# Patient Record
Sex: Female | Born: 1990 | Race: White | Hispanic: No | Smoking: Current every day smoker
Health system: Southern US, Community
[De-identification: ages and names within clinical notes are randomized; demographics above are authoritative.]

## PROBLEM LIST (undated history)

## (undated) ENCOUNTER — Inpatient Hospital Stay (HOSPITAL_COMMUNITY): Payer: Self-pay

## (undated) DIAGNOSIS — F419 Anxiety disorder, unspecified: Secondary | ICD-10-CM

## (undated) DIAGNOSIS — F319 Bipolar disorder, unspecified: Secondary | ICD-10-CM

## (undated) HISTORY — PX: TONSILLECTOMY: SUR1361

## (undated) HISTORY — PX: TYMPANOSTOMY TUBE PLACEMENT: SHX32

---
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
## 2013-10-01 ENCOUNTER — Ambulatory Visit (HOSPITAL_COMMUNITY): Payer: Self-pay | Admitting: Psychology

## 2013-10-16 ENCOUNTER — Emergency Department (HOSPITAL_COMMUNITY): Payer: 59

## 2013-10-16 ENCOUNTER — Encounter (HOSPITAL_COMMUNITY): Payer: Self-pay | Admitting: Emergency Medicine

## 2013-10-16 ENCOUNTER — Emergency Department (HOSPITAL_COMMUNITY)
Admission: EM | Admit: 2013-10-16 | Discharge: 2013-10-16 | Disposition: A | Discharge status: Home / Self Care | Payer: 59 | Attending: Emergency Medicine | Admitting: Emergency Medicine

## 2013-10-16 DIAGNOSIS — Y9389 Activity, other specified: Secondary | ICD-10-CM | POA: Insufficient documentation

## 2013-10-16 DIAGNOSIS — S0990XA Unspecified injury of head, initial encounter: Secondary | ICD-10-CM | POA: Insufficient documentation

## 2013-10-16 DIAGNOSIS — S9030XA Contusion of unspecified foot, initial encounter: Secondary | ICD-10-CM | POA: Insufficient documentation

## 2013-10-16 DIAGNOSIS — Z8659 Personal history of other mental and behavioral disorders: Secondary | ICD-10-CM | POA: Insufficient documentation

## 2013-10-16 DIAGNOSIS — F17200 Nicotine dependence, unspecified, uncomplicated: Secondary | ICD-10-CM | POA: Insufficient documentation

## 2013-10-16 DIAGNOSIS — Y92410 Unspecified street and highway as the place of occurrence of the external cause: Secondary | ICD-10-CM | POA: Insufficient documentation

## 2013-10-16 DIAGNOSIS — S9031XA Contusion of right foot, initial encounter: Secondary | ICD-10-CM

## 2013-10-16 HISTORY — DX: Bipolar disorder, unspecified: F31.9

## 2013-10-16 HISTORY — DX: Anxiety disorder, unspecified: F41.9

## 2013-10-16 MED ORDER — ACETAMINOPHEN 325 MG PO TABS
650.0000 mg | ORAL_TABLET | Freq: Once | ORAL | Status: AC
  Administered 2013-10-16: 650 mg via ORAL
  Filled 2013-10-16: qty 2
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
## 2013-11-07 ENCOUNTER — Ambulatory Visit (HOSPITAL_COMMUNITY): Payer: Self-pay | Admitting: Psychiatry

## 2017-07-04 ENCOUNTER — Other Ambulatory Visit (HOSPITAL_COMMUNITY): Payer: Self-pay | Admitting: Nurse Practitioner

## 2017-07-04 DIAGNOSIS — Z369 Encounter for antenatal screening, unspecified: Secondary | ICD-10-CM

## 2017-07-12 ENCOUNTER — Ambulatory Visit (HOSPITAL_COMMUNITY): Payer: No Typology Code available for payment source

## 2017-07-12 ENCOUNTER — Encounter (HOSPITAL_COMMUNITY): Payer: Self-pay

## 2017-07-13 ENCOUNTER — Other Ambulatory Visit (HOSPITAL_COMMUNITY): Payer: Self-pay | Admitting: Nurse Practitioner

## 2017-07-13 ENCOUNTER — Encounter (HOSPITAL_COMMUNITY): Payer: Self-pay

## 2017-07-13 ENCOUNTER — Ambulatory Visit (HOSPITAL_COMMUNITY)
Admission: RE | Admit: 2017-07-13 | Discharge: 2017-07-13 | Disposition: A | Discharge status: Home / Self Care | Payer: 59 | Source: Ambulatory Visit | Attending: Nurse Practitioner | Admitting: Nurse Practitioner

## 2017-07-13 DIAGNOSIS — Z369 Encounter for antenatal screening, unspecified: Secondary | ICD-10-CM | POA: Insufficient documentation

## 2017-07-13 DIAGNOSIS — O359XX0 Maternal care for (suspected) fetal abnormality and damage, unspecified, not applicable or unspecified: Secondary | ICD-10-CM

## 2017-07-13 DIAGNOSIS — Z3682 Encounter for antenatal screening for nuchal translucency: Secondary | ICD-10-CM

## 2017-07-13 DIAGNOSIS — Z3A12 12 weeks gestation of pregnancy: Secondary | ICD-10-CM | POA: Insufficient documentation

## 2017-08-01 ENCOUNTER — Other Ambulatory Visit: Payer: Self-pay

## 2017-08-07 ENCOUNTER — Other Ambulatory Visit (HOSPITAL_COMMUNITY): Payer: Self-pay | Admitting: Nurse Practitioner

## 2017-08-07 DIAGNOSIS — Z3A19 19 weeks gestation of pregnancy: Secondary | ICD-10-CM

## 2017-08-07 DIAGNOSIS — O99322 Drug use complicating pregnancy, second trimester: Secondary | ICD-10-CM

## 2017-08-07 DIAGNOSIS — Z3689 Encounter for other specified antenatal screening: Secondary | ICD-10-CM

## 2017-08-24 ENCOUNTER — Encounter (HOSPITAL_COMMUNITY): Payer: Self-pay | Admitting: Nurse Practitioner

## 2017-08-29 ENCOUNTER — Other Ambulatory Visit (HOSPITAL_COMMUNITY): Payer: Self-pay

## 2017-09-01 ENCOUNTER — Ambulatory Visit (HOSPITAL_COMMUNITY)
Admission: RE | Admit: 2017-09-01 | Discharge: 2017-09-01 | Disposition: A | Discharge status: Home / Self Care | Payer: 59 | Source: Ambulatory Visit | Attending: Nurse Practitioner | Admitting: Nurse Practitioner

## 2017-09-01 DIAGNOSIS — O358XX0 Maternal care for other (suspected) fetal abnormality and damage, not applicable or unspecified: Secondary | ICD-10-CM | POA: Insufficient documentation

## 2017-09-01 DIAGNOSIS — F1910 Other psychoactive substance abuse, uncomplicated: Secondary | ICD-10-CM

## 2017-09-01 DIAGNOSIS — Z3A19 19 weeks gestation of pregnancy: Secondary | ICD-10-CM | POA: Diagnosis not present

## 2017-09-01 DIAGNOSIS — O99322 Drug use complicating pregnancy, second trimester: Secondary | ICD-10-CM | POA: Insufficient documentation

## 2017-09-01 DIAGNOSIS — Z3689 Encounter for other specified antenatal screening: Secondary | ICD-10-CM

## 2017-11-19 ENCOUNTER — Inpatient Hospital Stay (HOSPITAL_COMMUNITY)
Admission: AD | Admit: 2017-11-19 | Discharge: 2017-11-19 | Disposition: A | Discharge status: Home / Self Care | Payer: 59 | Source: Ambulatory Visit | Attending: Obstetrics & Gynecology | Admitting: Obstetrics & Gynecology

## 2017-11-19 ENCOUNTER — Encounter (HOSPITAL_COMMUNITY): Payer: Self-pay

## 2017-11-19 ENCOUNTER — Other Ambulatory Visit: Payer: Self-pay

## 2017-11-19 DIAGNOSIS — Z818 Family history of other mental and behavioral disorders: Secondary | ICD-10-CM | POA: Insufficient documentation

## 2017-11-19 DIAGNOSIS — Z87891 Personal history of nicotine dependence: Secondary | ICD-10-CM | POA: Insufficient documentation

## 2017-11-19 DIAGNOSIS — Z823 Family history of stroke: Secondary | ICD-10-CM | POA: Insufficient documentation

## 2017-11-19 DIAGNOSIS — Z811 Family history of alcohol abuse and dependence: Secondary | ICD-10-CM | POA: Diagnosis not present

## 2017-11-19 DIAGNOSIS — O368130 Decreased fetal movements, third trimester, not applicable or unspecified: Secondary | ICD-10-CM | POA: Insufficient documentation

## 2017-11-19 DIAGNOSIS — Z3689 Encounter for other specified antenatal screening: Secondary | ICD-10-CM

## 2017-11-19 DIAGNOSIS — Z3A31 31 weeks gestation of pregnancy: Secondary | ICD-10-CM

## 2017-11-19 LAB — URINALYSIS, ROUTINE W REFLEX MICROSCOPIC
Bilirubin Urine: NEGATIVE
Glucose, UA: NEGATIVE mg/dL
Hgb urine dipstick: NEGATIVE
KETONES UR: NEGATIVE mg/dL
Leukocytes, UA: NEGATIVE
NITRITE: NEGATIVE
PROTEIN: NEGATIVE mg/dL
Specific Gravity, Urine: 1.002 — ABNORMAL LOW (ref 1.005–1.030)
pH: 6 (ref 5.0–8.0)

## 2017-11-19 NOTE — MAU Provider Note (Signed)
History     CSN: [id number]  Arrival date and time: 11/19/17 1815   [id number] Provider Initiated Contact with Patient 11/19/17 1834      Chief Complaint  Patient presents with  . Decreased Fetal Movement   G1 @31 .0 wks here with decreased FM. She states she felt her abdomen get hard this afternoon and then after that she didn't feel as much movement. Denies VB, LOF, and ctx. No pain now. Felt movement on the way here. She is eating and drinking well today.   OB History    Gravida Para Term Preterm AB Living   1             SAB TAB Ectopic Multiple Live Births                  Past Medical History:  Diagnosis Date  . Anxiety    med prior to pregn  . Bipolar 1 disorder [facility](HCC)     Past Surgical History:  Procedure Laterality Date  . TONSILLECTOMY    . TONSILLECTOMY    . TYMPANOSTOMY TUBE PLACEMENT      Family History  Problem Relation Age of Onset  . Depression Mother   . Stroke Mother   . Alcohol abuse Father   . COPD Maternal Grandfather   . Alcohol abuse Paternal Grandmother   . Heart disease Paternal Grandfather     Social History   Tobacco Use  . Smoking status: Former Games developermoker  . Smokeless tobacco: Never Used  Substance Use Topics  . Alcohol use: No  . Drug use: No    Comment: quit with pregnancy    Allergies: No Known Allergies  Medications Prior to Admission  Medication Sig Dispense Refill Last Dose  . HydrOXYzine Pamoate (VISTARIL PO) Take by mouth.   Taking  . Prenatal Vit-Fe Fumarate-FA (PRENATAL VITAMIN PO) Take by mouth.   Taking    Review of Systems  Gastrointestinal: Negative for abdominal pain.  Genitourinary: Negative for vaginal bleeding.   Physical Exam   Blood pressure 130/75, pulse 79, temperature 98.1 F (36.7 C), temperature source Oral, resp. rate 18, last menstrual period 04/09/2017, SpO2 99 %.  Physical Exam  Nursing note and vitals reviewed. Constitutional: She is oriented to person, place, and time. She appears  well-developed and well-nourished. No distress.  HENT:  Head: Normocephalic and atraumatic.  Neck: Normal range of motion.  Respiratory: Effort normal. No respiratory distress.  GI: Soft. She exhibits no distension. There is no tenderness.  gravid  Musculoskeletal: Normal range of motion.  Neurological: She is alert and oriented to person, place, and time.  Skin: Skin is warm and dry.  Psychiatric: She has a normal mood and affect.  EFM: 145 bpm, mod variability, + accels, no decels Toco: none  Results for orders placed or performed during the hospital encounter of 11/19/17 (from the past 24 hour(s))  Urinalysis, Routine w reflex microscopic     Status: Abnormal   Collection Time: 11/19/17  6:20 PM  Result Value Ref Range   Color, Urine STRAW (A) YELLOW   APPearance CLEAR CLEAR   Specific Gravity, Urine 1.002 (L) 1.005 - 1.030   pH 6.0 5.0 - 8.0   Glucose, UA NEGATIVE NEGATIVE mg/dL   Hgb urine dipstick NEGATIVE NEGATIVE   Bilirubin Urine NEGATIVE NEGATIVE   Ketones, ur NEGATIVE NEGATIVE mg/dL   Protein, ur NEGATIVE NEGATIVE mg/dL   Nitrite NEGATIVE NEGATIVE   Leukocytes, UA NEGATIVE NEGATIVE   MAU Course  Procedures  MDM Labs ordered and reviewed. Audible FM on EFM. Pt feeling FM. NST reactive. Pt reassured. Stable for discharge home.  Assessment and Plan   1. [redacted] weeks gestation of pregnancy   2. NST (non-stress test) reactive   3. Decreased fetal movements in third trimester, single or unspecified fetus    Discharge home Follow up in OB office as scheduled FMCs  Allergies as of 11/19/2017   No Known Allergies     Medication List    TAKE these medications   PRENATAL VITAMIN PO Take by mouth.   VISTARIL PO Take by mouth.      Donette LarryMelanie Elza Varricchio, CNM 11/19/2017, 7:04 PM

## 2017-11-19 NOTE — MAU Note (Signed)
Urine in lab 

## 2017-11-19 NOTE — MAU Note (Signed)
Patient presents with decreased fetal movement stating she has felt the baby today but not near as much.

## 2017-11-19 NOTE — Discharge Instructions (Signed)

## 2017-11-21 NOTE — L&D Delivery Note (Signed)
Delivery Note Pt became complete at 0913 and pushed very well; at 9:38 AM a viable female was delivered via Vaginal, Spontaneous (Presentation: ROA).  APGAR: 8, 9; weight: pending.  No difficulty with delivery of shoulders; infant dried and placed on pt's abd; cord clamped and cut by FOB; hospital cord blood sample collected. Placenta status: spont ,intact .  Cord: 3 vessel  Anesthesia:  None Episiotomy: None Lacerations: None (sm R labial abrasion, not repaired) Est. Blood Loss (mL): 200   Mom to postpartum.  Baby to Couplet care / Skin to Skin.  Cam HaiSHAW, Aubery Date CNM 01/23/2018, 10:09 AM

## 2018-01-23 ENCOUNTER — Other Ambulatory Visit: Payer: Self-pay

## 2018-01-23 ENCOUNTER — Inpatient Hospital Stay (HOSPITAL_COMMUNITY)
Admission: AD | Admit: 2018-01-23 | Discharge: 2018-01-25 | DRG: 806 | Disposition: A | Discharge status: Home / Self Care | Payer: 59 | Source: Ambulatory Visit | Attending: Family Medicine | Admitting: Family Medicine

## 2018-01-23 ENCOUNTER — Encounter (HOSPITAL_COMMUNITY): Payer: Self-pay | Admitting: *Deleted

## 2018-01-23 DIAGNOSIS — Z3A40 40 weeks gestation of pregnancy: Secondary | ICD-10-CM

## 2018-01-23 DIAGNOSIS — F419 Anxiety disorder, unspecified: Secondary | ICD-10-CM | POA: Diagnosis present

## 2018-01-23 DIAGNOSIS — Z87891 Personal history of nicotine dependence: Secondary | ICD-10-CM

## 2018-01-23 DIAGNOSIS — O99344 Other mental disorders complicating childbirth: Secondary | ICD-10-CM | POA: Diagnosis present

## 2018-01-23 DIAGNOSIS — O9832 Other infections with a predominantly sexual mode of transmission complicating childbirth: Secondary | ICD-10-CM | POA: Diagnosis present

## 2018-01-23 DIAGNOSIS — F319 Bipolar disorder, unspecified: Secondary | ICD-10-CM | POA: Diagnosis present

## 2018-01-23 DIAGNOSIS — Z3483 Encounter for supervision of other normal pregnancy, third trimester: Secondary | ICD-10-CM | POA: Diagnosis present

## 2018-01-23 DIAGNOSIS — A6000 Herpesviral infection of urogenital system, unspecified: Secondary | ICD-10-CM | POA: Diagnosis present

## 2018-01-23 LAB — CBC
HEMATOCRIT: 37.1 % (ref 36.0–46.0)
HEMOGLOBIN: 13.3 g/dL (ref 12.0–15.0)
MCH: 33.3 pg (ref 26.0–34.0)
MCHC: 35.8 g/dL (ref 30.0–36.0)
MCV: 92.8 fL (ref 78.0–100.0)
Platelets: 176 10*3/uL (ref 150–400)
RBC: 4 MIL/uL (ref 3.87–5.11)
RDW: 14.1 % (ref 11.5–15.5)
WBC: 15.1 10*3/uL — AB (ref 4.0–10.5)

## 2018-01-23 LAB — TYPE AND SCREEN
ABO/RH(D): A POS
ANTIBODY SCREEN: NEGATIVE

## 2018-01-23 LAB — RPR: RPR: NONREACTIVE

## 2018-01-23 LAB — ABO/RH: ABO/RH(D): A POS

## 2018-01-23 MED ORDER — SENNOSIDES-DOCUSATE SODIUM 8.6-50 MG PO TABS
2.0000 | ORAL_TABLET | ORAL | Status: DC
  Administered 2018-01-24 – 2018-01-25 (×2): 2 via ORAL
  Filled 2018-01-23 (×2): qty 2

## 2018-01-23 MED ORDER — ZOLPIDEM TARTRATE 5 MG PO TABS: 5.0000 mg | ORAL_TABLET | Freq: Every evening | ORAL | Status: DC | PRN

## 2018-01-23 MED ORDER — SOD CITRATE-CITRIC ACID 500-334 MG/5ML PO SOLN: 30.0000 mL | ORAL | Status: DC | PRN

## 2018-01-23 MED ORDER — PRENATAL MULTIVITAMIN CH
1.0000 | ORAL_TABLET | Freq: Every day | ORAL | Status: DC
  Administered 2018-01-23 – 2018-01-25 (×3): 1 via ORAL
  Filled 2018-01-23 (×3): qty 1

## 2018-01-23 MED ORDER — ACETAMINOPHEN 325 MG PO TABS: 650.0000 mg | ORAL_TABLET | ORAL | Status: DC | PRN

## 2018-01-23 MED ORDER — OXYCODONE-ACETAMINOPHEN 5-325 MG PO TABS: 1.0000 | ORAL_TABLET | ORAL | Status: DC | PRN

## 2018-01-23 MED ORDER — FENTANYL CITRATE (PF) 100 MCG/2ML IJ SOLN
100.0000 ug | INTRAMUSCULAR | Status: DC | PRN
  Administered 2018-01-23: 100 ug via INTRAVENOUS
  Filled 2018-01-23: qty 2

## 2018-01-23 MED ORDER — COCONUT OIL OIL
1.0000 "application " | TOPICAL_OIL | Status: DC | PRN
  Administered 2018-01-24: 1 via TOPICAL
  Filled 2018-01-23: qty 120

## 2018-01-23 MED ORDER — SIMETHICONE 80 MG PO CHEW: 80.0000 mg | CHEWABLE_TABLET | ORAL | Status: DC | PRN

## 2018-01-23 MED ORDER — ACETAMINOPHEN 325 MG PO TABS
650.0000 mg | ORAL_TABLET | ORAL | Status: DC | PRN
  Administered 2018-01-23 – 2018-01-25 (×4): 650 mg via ORAL
  Filled 2018-01-23 (×4): qty 2

## 2018-01-23 MED ORDER — WITCH HAZEL-GLYCERIN EX PADS: 1.0000 "application " | MEDICATED_PAD | CUTANEOUS | Status: DC | PRN

## 2018-01-23 MED ORDER — FLEET ENEMA 7-19 GM/118ML RE ENEM: 1.0000 | ENEMA | RECTAL | Status: DC | PRN

## 2018-01-23 MED ORDER — ONDANSETRON HCL 4 MG/2ML IJ SOLN: 4.0000 mg | Freq: Four times a day (QID) | INTRAMUSCULAR | Status: DC | PRN

## 2018-01-23 MED ORDER — IBUPROFEN 600 MG PO TABS
600.0000 mg | ORAL_TABLET | Freq: Four times a day (QID) | ORAL | Status: DC
  Administered 2018-01-23 – 2018-01-25 (×9): 600 mg via ORAL
  Filled 2018-01-23 (×9): qty 1

## 2018-01-23 MED ORDER — OXYTOCIN BOLUS FROM INFUSION
500.0000 mL | Freq: Once | INTRAVENOUS | Status: AC
  Administered 2018-01-23: 500 mL via INTRAVENOUS

## 2018-01-23 MED ORDER — TETANUS-DIPHTH-ACELL PERTUSSIS 5-2.5-18.5 LF-MCG/0.5 IM SUSP: 0.5000 mL | Freq: Once | INTRAMUSCULAR | Status: DC

## 2018-01-23 MED ORDER — LIDOCAINE HCL (PF) 1 % IJ SOLN
30.0000 mL | INTRAMUSCULAR | Status: DC | PRN
  Filled 2018-01-23 (×2): qty 30

## 2018-01-23 MED ORDER — ONDANSETRON HCL 4 MG PO TABS
4.0000 mg | ORAL_TABLET | ORAL | Status: DC | PRN
Start: 2018-01-23 — End: 2018-01-25

## 2018-01-23 MED ORDER — DIPHENHYDRAMINE HCL 25 MG PO CAPS
25.0000 mg | ORAL_CAPSULE | Freq: Four times a day (QID) | ORAL | Status: DC | PRN
  Filled 2018-01-23: qty 1

## 2018-01-23 MED ORDER — DIBUCAINE 1 % RE OINT: 1.0000 "application " | TOPICAL_OINTMENT | RECTAL | Status: DC | PRN

## 2018-01-23 MED ORDER — OXYTOCIN 40 UNITS IN LACTATED RINGERS INFUSION - SIMPLE MED
2.5000 [IU]/h | INTRAVENOUS | Status: DC
  Filled 2018-01-23 (×2): qty 1000

## 2018-01-23 MED ORDER — LACTATED RINGERS IV SOLN: 500.0000 mL | INTRAVENOUS | Status: DC | PRN

## 2018-01-23 MED ORDER — ONDANSETRON HCL 4 MG/2ML IJ SOLN: 4.0000 mg | INTRAMUSCULAR | Status: DC | PRN

## 2018-01-23 MED ORDER — OXYCODONE-ACETAMINOPHEN 5-325 MG PO TABS: 2.0000 | ORAL_TABLET | ORAL | Status: DC | PRN

## 2018-01-23 MED ORDER — OXYCODONE HCL 5 MG PO TABS: 5.0000 mg | ORAL_TABLET | ORAL | Status: DC | PRN

## 2018-01-23 MED ORDER — LACTATED RINGERS IV SOLN
INTRAVENOUS | Status: DC
  Administered 2018-01-23: 05:00:00 via INTRAVENOUS

## 2018-01-23 MED ORDER — BENZOCAINE-MENTHOL 20-0.5 % EX AERO
1.0000 "application " | INHALATION_SPRAY | CUTANEOUS | Status: DC | PRN
  Administered 2018-01-23: 1 via TOPICAL
  Filled 2018-01-23: qty 56

## 2018-01-23 NOTE — H&P (Signed)
OBSTETRIC ADMISSION HISTORY AND PHYSICAL  Lauren Powers is a 27 y.o. female G1P0 with IUP at 8577w2d by LMP presenting for spontaneous onset of labor. She reports contractions that got worse around 0100. She reports +FMs, No LOF, no VB, no blurry vision, headaches or peripheral edema, and RUQ pain.  She plans on Breast feeding. She request IUD for birth control. [facility]   Dating: By LMP --->  Estimated Date of Delivery: 01/21/18  Prenatal History/Complications:  Past Medical History: Past Medical History:  Diagnosis Date  . Anxiety    med prior to pregn  . Bipolar 1 disorder [facility](HCC)     Past Surgical History: Past Surgical History:  Procedure Laterality Date  . TONSILLECTOMY    . TONSILLECTOMY    . TYMPANOSTOMY TUBE PLACEMENT      Obstetrical History: OB History    Gravida Para Term Preterm AB Living   1             SAB TAB Ectopic Multiple Live Births                  Social History: Social History   Socioeconomic History  . Marital status: Legally Separated    Spouse name: None  . Number of children: None  . Years of education: None  . Highest education level: None  Social Needs  . Financial resource strain: None  . Food insecurity - worry: None  . Food insecurity - inability: None  . Transportation needs - medical: None  . Transportation needs - non-medical: None  Occupational History  . None  Tobacco Use  . Smoking status: Former Games developermoker  . Smokeless tobacco: Never Used  Substance and Sexual Activity  . Alcohol use: No  . Drug use: No    Comment: quit with pregnancy  . Sexual activity: Yes    Birth control/protection: None  Other Topics Concern  . None  Social History Narrative  . None    Family History: Family History  Problem Relation Age of Onset  . Depression Mother   . Stroke Mother   . Alcohol abuse Father   . COPD Maternal Grandfather   . Alcohol abuse Paternal Grandmother   . Heart disease Paternal  Grandfather     Allergies: No Known Allergies  Medications Prior to Admission  Medication Sig Dispense Refill Last Dose  . acetaminophen (TYLENOL) 500 MG tablet Take 500 mg by mouth every 6 (six) hours as needed.   01/23/2018 at Unknown time  . HydrOXYzine Pamoate (VISTARIL PO) Take by mouth.   01/22/2018 at Unknown time  . Prenatal Vit-Fe Fumarate-FA (PRENATAL VITAMIN PO) Take by mouth.   01/22/2018 at Unknown time  . valACYclovir (VALTREX) 500 MG tablet Take 500 mg by mouth 2 (two) times daily.   01/22/2018 at Unknown time   Review of Systems   All systems reviewed and negative except as stated in HPI  Blood pressure 128/88, pulse 76, temperature 98.2 F (36.8 C), temperature source Oral, resp. rate 18, height 5\' 11"  (1.803 m), weight 202 lb (91.6 kg), last menstrual period 04/09/2017. General appearance: alert, cooperative and no distress Lungs: clear to auscultation bilaterally Heart: regular rate and rhythm Abdomen: soft, non-tender; bowel sounds normal Pelvic: n/a Extremities: Homans sign is negative, no sign of DVT DTR's +2 Presentation: cephalic Fetal monitoringBaseline: 125 bpm, Variability: Good {> 6 bpm), Accelerations: Reactive and Decelerations: Absent Uterine activityFrequency: Every 2-3 minutes Dilation: 5 Effacement (%): 90 Station: -2 Exam by::  B Mosca   Prenatal labs: ABO, Rh: --/--/A POS (03/05 9604) Antibody: PENDING (03/05 0456) Rubella:   RPR:    HBsAg:    HIV:    GBS:   neg  Prenatal Transfer Tool  Maternal Diabetes: No Genetic Screening: Normal Maternal Ultrasounds/Referrals: Normal Fetal Ultrasounds or other Referrals:  None Maternal Substance Abuse:  No Significant Maternal Medications:  None Significant Maternal Lab Results: Lab values include: Group B Strep negative  Results for orders placed or performed during the hospital encounter of 01/23/18 (from the past 24 hour(s))  CBC   Collection Time: 01/23/18  4:56 AM  Result Value Ref Range    WBC 15.1 (H) 4.0 - 10.5 K/uL   RBC 4.00 3.87 - 5.11 MIL/uL   Hemoglobin 13.3 12.0 - 15.0 g/dL   HCT 54.0 98.1 - 19.1 %   MCV 92.8 78.0 - 100.0 fL   MCH 33.3 26.0 - 34.0 pg   MCHC 35.8 30.0 - 36.0 g/dL   RDW 47.8 29.5 - 62.1 %   Platelets 176 150 - 400 K/uL  Type and screen [facility]   Collection Time: 01/23/18  4:56 AM  Result Value Ref Range   ABO/RH(D) A POS    Antibody Screen PENDING    Sample Expiration      01/26/2018 Performed at [facility], [address] [place], [place] [address]     Patient Active Problem List   Diagnosis Date Noted  . Normal labor and delivery 01/23/2018    Assessment/Plan:  Lauren Powers is a 27 y.o. G1P0 at [redacted]w[redacted]d here for spontaneous onset of labor.  #Labor: Expectant management. Anticipate NSVD #Pain: Planning epidural #FWB: Cat 1 #ID:  GBS neg #MOF: Breast #MOC: IUD #Circ:  n/a  Rolm Bookbinder, CNM  01/23/2018, 6:00 AM

## 2018-01-23 NOTE — Anesthesia Pain Management Evaluation Note (Signed)
  CRNA Pain Management Visit Note  Patient: Lauren Powers, 27 y.o., female  "Hello I am a member of the anesthesia team at [facility]. We have an anesthesia team available at all times to provide care throughout the hospital, including epidural management and anesthesia for C-section. I don't know your plan for the delivery whether it a natural birth, water birth, IV sedation, nitrous supplementation, doula or epidural, but we want to meet your pain goals."   1.Was your pain managed to your expectations on prior hospitalizations?   No prior hospitalizations  2.What is your expectation for pain management during this hospitalization?     Nitrous Oxide  3.How can we help you reach that goal? Be available  Record the patient's initial score and the patient's pain goal.   Pain: 8  Pain Goal: 8 [facility] wants you to be able to say your pain was always managed very well.  Lauren Powers,Lauren Powers 01/23/2018

## 2018-01-23 NOTE — Progress Notes (Signed)
POSTPARTUM PROGRESS NOTE  Post Partum Day 1 Subjective:  Lauren Powers is a 27 y.o. G1P1001 3336w2d s/p SVD.  No acute events overnight.  Pt denies problems with ambulating, voiding or po intake.  She denies nausea or vomiting.  Pain is well controlled. Lochia Minimal.   Objective: Blood pressure 115/71, pulse 88, temperature 98.4 F (36.9 C), temperature source Oral, resp. rate 14, height 5\' 11"  (1.803 m), weight 91.6 kg (202 lb), last menstrual period 04/09/2017, SpO2 99 %, unknown if currently breastfeeding.  Physical Exam:  General: alert, cooperative and no distress Lochia:normal flow Chest: no respiratory distress Heart:regular rate, distal pulses intact Abdomen: soft, nontender,  Uterine Fundus: firm, appropriately tender DVT Evaluation: No calf swelling or tenderness Extremities: no edema  Recent Labs    01/23/18 0456  HGB 13.3  HCT 37.1    Assessment/Plan:  ASSESSMENT: Lauren Nestlemanda Melito is a 27 y.o. G1P1001 5136w2d s/p SVD.  Patient had single elevated blood pressure of 133/95 during postpartum period. Will continue to monitor closely and plan for 1 week postpartum blood pressure check.   Plan for discharge tomorrow   LOS: 1 day   Darris Carachure MossDO 01/24/2018, 6:46 AM

## 2018-01-23 NOTE — MAU Note (Signed)
Pt reports UC's 2-6 for 3-4 hours. Reports Bloody show, but no LOF. + FM

## 2018-01-23 NOTE — Lactation Note (Signed)
This note was copied from a baby's chart. Lactation Consultation Note  Patient Name: Lauren Powers [id number]'s Date: 01/23/2018 Reason for consult: Initial assessment;1st time breastfeeding;Primapara;Term  1511 hours old female who is being exclusively BF by her mother, she's a P1. Baby was asleep when entering the room, per parents baby fed about an hour ago and she wasn't showing any cues. Mom states that feedings at the breast are comfortable without any pain or discomfort, she also heard swallows.  Baby had an episode of small emesis, parents asked for assistance with burping baby, LC showed parents how to burp her, baby was very gassy; she spitted up when [facility] her (emesis already mentioned above). Parents [facility].  Encouraged mom to keep putting baby at the breast STS and feed on feeding cues at least 8-12 times/24 hours. Reviewed BF brochure, BF resources and feeding diary. Mom is aware of LC services and will call PRN.  Maternal Data Formula Feeding for Exclusion: No Has patient been taught Hand Expression?: Yes Does the patient have breastfeeding experience prior to this delivery?: No  Feeding Feeding Type: Breast Fed  Interventions Interventions: Breast feeding basics reviewed;Skin to skin  Lactation Tools Discussed/Used WIC Program: Yes   Consult Status Consult Status: Follow-up Date: 01/24/18 Follow-up type: In-patient    Lewi Drost Venetia ConstableS Thekla Colborn 01/23/2018, 9:06 PM

## 2018-01-25 MED ORDER — IBUPROFEN 600 MG PO TABS: 600.0000 mg | ORAL_TABLET | Freq: Four times a day (QID) | ORAL | 0 refills | Status: AC

## 2018-01-25 NOTE — Discharge Instructions (Signed)
Vaginal Delivery, Care After °Refer to this sheet in the next few weeks. These instructions provide you with information about caring for yourself after vaginal delivery. Your health care provider may also give you more specific instructions. Your treatment has been planned according to current medical practices, but problems sometimes occur. Call your health care provider if you have any problems or questions. °What can I expect after the procedure? °After vaginal delivery, it is common to have: °· Some bleeding from your vagina. °· Soreness in your abdomen, your vagina, and the area of skin between your vaginal opening and your anus (perineum). °· Pelvic cramps. °· Fatigue. ° °Follow these instructions at home: °Medicines °· Take over-the-counter and prescription medicines only as told by your health care provider. °· If you were prescribed an antibiotic medicine, take it as told by your health care provider. Do not stop taking the antibiotic until it is finished. °Driving ° °· Do not drive or operate heavy machinery while taking prescription pain medicine. °· Do not drive for 24 hours if you received a sedative. °Lifestyle °· Do not drink alcohol. This is especially important if you are breastfeeding or taking medicine to relieve pain. °· Do not use tobacco products, including cigarettes, chewing tobacco, or e-cigarettes. If you need help quitting, ask your health care provider. °Eating and drinking °· Drink at least 8 eight-ounce glasses of water every day unless you are told not to by your health care provider. If you choose to breastfeed your baby, you may need to drink more water than this. °· Eat high-fiber foods every day. These foods may help prevent or relieve constipation. High-fiber foods include: °? Whole grain cereals and breads. °? Brown rice. °? Beans. °? Fresh fruits and vegetables. °Activity °· Return to your normal activities as told by your health care provider. Ask your health care provider  what activities are safe for you. °· Rest as much as possible. Try to rest or take a nap when your baby is sleeping. °· Do not lift anything that is heavier than your baby or 10 lb (4.5 kg) until your health care provider says that it is safe. °· Talk with your health care provider about when you can engage in sexual activity. This may depend on your: °? Risk of infection. °? Rate of healing. °? Comfort and desire to engage in sexual activity. °Vaginal Care °· If you have an episiotomy or a vaginal tear, check the area every day for signs of infection. Check for: °? More redness, swelling, or pain. °? More fluid or blood. °? Warmth. °? Pus or a bad smell. °· Do not use tampons or douches until your health care provider says this is safe. °· Watch for any blood clots that may pass from your vagina. These may look like clumps of dark red, brown, or black discharge. °General instructions °· Keep your perineum clean and dry as told by your health care provider. °· Wear loose, comfortable clothing. °· Wipe from front to back when you use the toilet. °· Ask your health care provider if you can shower or take a bath. If you had an episiotomy or a perineal tear during labor and delivery, your health care provider may tell you not to take baths for a certain length of time. °· Wear a bra that supports your breasts and fits you well. °· If possible, have someone help you with household activities and help care for your baby for at least a few days after   you leave the hospital. °· Keep all follow-up visits for you and your baby as told by your health care provider. This is important. °Contact a health care provider if: °· You have: °? Vaginal discharge that has a bad smell. °? Difficulty urinating. °? Pain when urinating. °? A sudden increase or decrease in the frequency of your bowel movements. °? More redness, swelling, or pain around your episiotomy or vaginal tear. °? More fluid or blood coming from your episiotomy or  vaginal tear. °? Pus or a bad smell coming from your episiotomy or vaginal tear. °? A fever. °? A rash. °? Little or no interest in activities you used to enjoy. °? Questions about caring for yourself or your baby. °· Your episiotomy or vaginal tear feels warm to the touch. °· Your episiotomy or vaginal tear is separating or does not appear to be healing. °· Your breasts are painful, hard, or turn red. °· You feel unusually sad or worried. °· You feel nauseous or you vomit. °· You pass large blood clots from your vagina. If you pass a blood clot from your vagina, save it to show to your health care provider. Do not flush blood clots down the toilet without having your health care provider look at them. °· You urinate more than usual. °· You are dizzy or light-headed. °· You have not breastfed at all and you have not had a menstrual period for 12 weeks after delivery. °· You have stopped breastfeeding and you have not had a menstrual period for 12 weeks after you stopped breastfeeding. °Get help right away if: °· You have: °? Pain that does not go away or does not get better with medicine. °? Chest pain. °? Difficulty breathing. °? Blurred vision or spots in your vision. °? Thoughts about hurting yourself or your baby. °· You develop pain in your abdomen or in one of your legs. °· You develop a severe headache. °· You faint. °· You bleed from your vagina so much that you fill two sanitary pads in one hour. °This information is not intended to replace advice given to you by your health care provider. Make sure you discuss any questions you have with your health care provider. °Document Released: 11/04/2000 Document Revised: 04/20/2016 Document Reviewed: 11/22/2015 °Elsevier Interactive Patient Education © 2018 [employer] ° °

## 2018-01-25 NOTE — Clinical Social Work Maternal (Signed)
CLINICAL SOCIAL WORK MATERNAL/CHILD NOTE  Patient Details  Name: Lauren Powers MRN: [id number] Date of Birth: 08/06/1991  Date:  01/25/2018  Clinical Social Worker Initiating Note:  Azelea Seguin Boyd-Gilyard Date/Time: Initiated:  01/25/18/1100     Child's Name:  Scarlet Cox Chrostowski   Biological Parents:  Mother, Father   Need for Interpreter:  None   Reason for Referral:  (MOB has hx of marijuana use and Biopolar disorder.)   Address:  [address] [place] [address]    Phone number:  [phone] (home) [phone] (work)    Additional phone number: FOB's telephone number is [phone].  Household Members/Support Persons (HM/SP):   Household Member/Support Person 1(MOB resides with MOB's father and stepmother. )   HM/SP Name Relationship DOB or Age  HM/SP -1 Justin Cox(MOB does not live with FOB, but plans to spend most of her time at FOB's home at [address] [place] [place] [address].) FOB 12/29/1992  HM/SP -2        HM/SP -3        HM/SP -4        HM/SP -5        HM/SP -6        HM/SP -7        HM/SP -8          Natural Supports (not living in the home):  Immediate Family, Extended Family, Friends, Parent   Professional Supports: Therapist(MOB is an estabished patinet with Kim Herzing at [facility]. )   Employment: Unemployed   Type of Work:     Education:  Some College   Homebound arranged:    Financial Resources:  Medicaid   Other Resources:  Food Stamps , WIC   Cultural/Religious Considerations Which May Impact Care:  None Reported  Strengths:  Ability to meet basic needs , Home prepared for child , Pediatrician chosen, Understanding of illness   Psychotropic Medications:         Pediatrician:    [place] area  Pediatrician List:   [place]    [place]    [place]    [place]    [place]    [place]      Pediatrician Fax Number:    Risk Factors/Current Problems:  Mental  Health Concerns , Substance Use    Cognitive State:  Alert , Able to Concentrate , Linear Thinking , Insightful    Mood/Affect:  Bright , Happy , Relaxed , Calm , Comfortable , Interested    CSW Assessment: CSW met with MOB to complete an assessment for MH hx and SA hx.  When CSW arrived, MOB was bonding with infant as evidence by MOB engaging in breastfeeding.  FOB was present and was sitting on the couch observing MOB's and infant's interaction.  MOB gave CSW permission to complete the assessment while FOB was present. MOB and FOB were polite, easy to engage, supportive of one another, and receptive to meeting with CSW.   CSW asked about MOB's MH hx and MOB reported a dx of bipolar disorder.  MOB shared that MOB was dx about 6 years ago and MOB's symptoms have been regulated with medication.s  However, MOB discontinued the use of medications after MOB's pregnancy confirmation. MOB reported that during pregnancy MOB has experienced little to no symptoms and has meet with therapist, Kim Herzing, at [facility] consistently.  CSW praised MOB and encouraged MOB to reassess for medication if a need arise; MOB agreed. CSW   provided education regarding the baby blues period vs. perinatal mood disorders, discussed treatment and gave resources for mental health follow up if concerns arise.  CSW recommends self-evaluation during the postpartum time period using the New Mom Checklist from Postpartum Progress and encouraged MOB to contact a medical professional if symptoms are noted at any time. MOB presented with insight and awareness and did not display any acute MH symptoms during the assessment. CSW assessed for safety and MOB denied SI, HI, and DV.   CSW also asked about MOB's SA hx.  MOB reported the use of marijuana throughout MOB's pregnancy.  MOB shared MOB's last use was December 2018.  CSW reviewed hospital's SA policy and informed MOB that infant's UDS was negative and CSW will continue to monitor  infant's CDS.  MOB and FOB are aware that CSW will make a report to [place] CPS if infant's CDS is positive without an explanation; parents were understanding. CSW offered MOB resources for SA counseling and MOB's was receptive.  CSW provided MOB with contact information to [facility].   CSW provided review of Sudden Infant Death Syndrome (SIDS) precautions.     MOB reported feeling prepared to parent and having all basic items for infant.  CSW thanked MOB and FOB for meeting with CSW.   CSW Plan/Description:  No Further Intervention Required/No Barriers to Discharge, Sudden Infant Death Syndrome (SIDS) Education, Other Information/Referral to Community Resources, Perinatal Mood and Anxiety Disorder (PMADs) Education, Hospital Drug Screen Policy Information, CSW Will Continue to Monitor Umbilical Cord Tissue Drug Screen Results and Make Report if Warranted   Damaya Channing Boyd-Gilyard, MSW, LCSW Clinical Social Work [phone]  Keonda Dow D BOYD-GILYARD, LCSW 01/25/2018, 11:51 AM 

## 2018-01-25 NOTE — Discharge Summary (Addendum)
OB Discharge Summary    Patient Name: Lauren Powers DOB: Oct 26, 1991 MRN: [id number] Date of admission: 01/23/2018  Delivering MD: Cam Hai D )  Date of discharge: 01/25/2018  Admitting diagnosis: SOL Intrauterine pregnancy: [redacted]w[redacted]d    Secondary diagnosis:  Active Problems:   Patient Active Problem List   Diagnosis Date Noted  . 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 yo G1P1001 at [redacted]w[redacted]d was admitted in Active Labor on 01/23/2018. Patient had an uncomplicated labor course as follows:  Membrane Rupture Time/Date: 6:39 AM ,01/23/2018   Intrapartum Procedures: Episiotomy: None [1]                                         Lacerations:  None [1]  Patient had a delivery of a Viable infant. 01/23/2018  Information for the patient's newborn:  Lauren Powers [[id number]]  Delivery Method: Vag-Spont   [name] had a postpartum course notable for isolated elevated BP of 133/95, 149/90. BP normalized prior to discharge. She is ambulating, tolerating a regular diet, passing flatus, and urinating well. Patient is discharged home in stable condition on 01/25/18.  Physical exam  Vitals:   01/24/18 1756 01/25/18 0556  BP: 122/78 132/78  Pulse: 78   Resp: 18 (!) 53  Temp:  97.7 F (36.5 C)  SpO2:     General: alert, cooperative and no distress Lochia: appropriate Uterine Fundus: firm Incision: N/A DVT Evaluation: No evidence of DVT seen on physical exam.  Labs: No results found for this or any previous visit (from the past 24 hour(s)).  Discharge instruction: per After Visit Summary and "Baby and Me Booklet".  After  visit meds:  No Known Allergies  Allergies as of 01/25/2018   No Known Allergies     Medication List    TAKE these medications   acetaminophen 500 MG tablet Commonly known as:  TYLENOL Take 500 mg by mouth every 6 (six) hours as needed.   ibuprofen 600 MG tablet Commonly known as:  ADVIL,MOTRIN Take 1 tablet (600 mg total) by mouth every 6 (six) hours.   PRENATAL VITAMIN PO Take by mouth.   valACYclovir 500 MG tablet Commonly known as:  VALTREX Take 500 mg by mouth 2 (two) times daily.   VISTARIL PO Take by mouth.      Diet: routine diet  Activity: Advance as tolerated. Pelvic rest for 6 weeks.   Outpatient follow up:N/A Future Appointments: No future appointments.  Follow up Appt: No Follow-up on file.  Postpartum contraception: Nexplanon  Newborn Data: APGAR (1 MIN): 8  APGAR (5 MINS): 9    Baby Feeding: Breast Disposition:home with mother  Lauren Denselison Rumball, DO 01/25/2018  I confirm that I have verified the information documented in the resident's note and that I have also personally reperformed the physical exam and all medical decision making activities.  Patient was seen and examined by me also Agree with note Vitals stable Labs stable Fundus firm, lochia within normal limits Perineum healing Ext WNL  Ready for discharge  Lauren Powers, Lauren Powers, CNM

## 2018-01-25 NOTE — Lactation Note (Signed)
This note was copied from a baby's chart. Lactation Consultation Note  Patient Name: Lauren Iver Nestlemanda Linford ZHYQM'VToday's Date: 01/25/2018 Reason for consult: Follow-up assessment;1st time breastfeeding;Primapara;Term;Nipple pain/trauma  Baby is 8049 hours old LC reviewed and updated the doc flow sheets  As LC entered the room baby latched with depth , LC noted the upper lip  To be flipped under and LC flipped to increased flanged lips and increased swallows noted.  And per mom more comfortable.  Sore nipple and engorgement prevention and tx reviewed.  LC instructed mom on the use hand pump , shells, she already has comfort gels and coconut oil.  LC recommended after feeding to apply the cold comfort gels until warm , then a dab of coconut oil  And then shells between feedings except when sleeping.  LC assessed breast tissue after feeding and noted the nipple to be well rounded when the baby released.  The nipples are both pinky red, no breakdown, mom fair skin.  Mother informed of post-discharge support and given phone number to the lactation department, including services for phone call assistance; out-patient appointments; and breastfeeding support group. List of other breastfeeding resources in the community given in the handout. Encouraged mother to call for problems or concerns related to breastfeeding.   Maternal Data Has patient been taught Hand Expression?: Yes  Feeding Feeding Type: (baby latched - swallows noted ) Length of feed: 10 min(LC noted a Deep latch, swallows. per mom cluster feeding )  LATCH Score Latch: (latched with depth )  Audible Swallowing: (swallows noted, increased with breast compressions )  Type of Nipple: (nipple erect and well rounded when baby released )  Comfort (Breast/Nipple): (nipples pinky red )  Hold (Positioning): (mom independent with latch )     Interventions Interventions: Breast feeding basics reviewed;Breast compression;Adjust  position;Shells;Coconut oil;Comfort gels;Hand pump  Lactation Tools Discussed/Used Tools: Shells;Flanges;Pump Flange Size: 27;24 Shell Type: Inverted Breast pump type: Manual WIC Program: Yes Pump Review: Setup, frequency, and cleaning;Milk Storage Initiated by:: MAI  Date initiated:: 01/25/18   Consult Status Consult Status: Complete Date: 01/25/18    Lauren Powers 01/25/2018, 11:21 AM

## 2018-01-25 NOTE — Progress Notes (Deleted)
Bilirubin at low risk zone at 5.5 at 37 hours of age. No interventions necessary

## 2018-12-16 IMAGING — US US MFM OB DETAIL+14 WK
1 series · 14 of 28 positions shown · non-contrast
Comparison: none

[Series 1: us mfm ob detail+14 wk · 80 acquisitions, 14 frames shown]
[im 3/80]
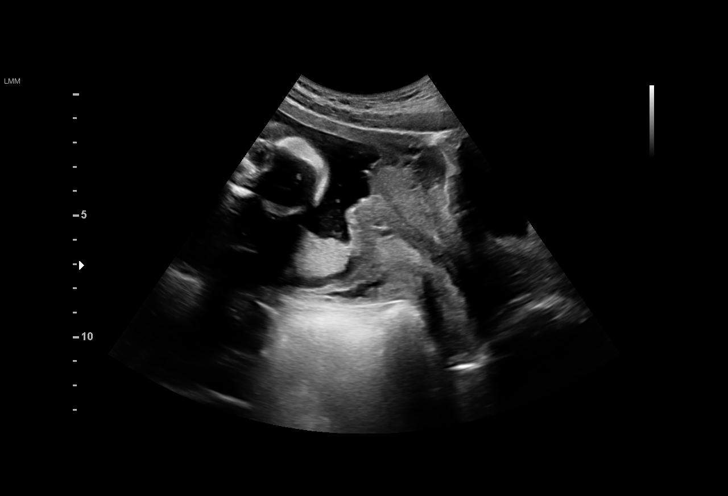
[im 9/80]
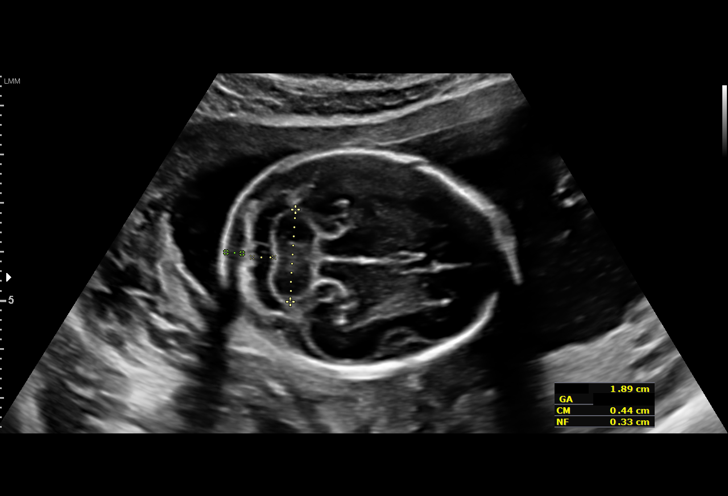
[im 15/80]
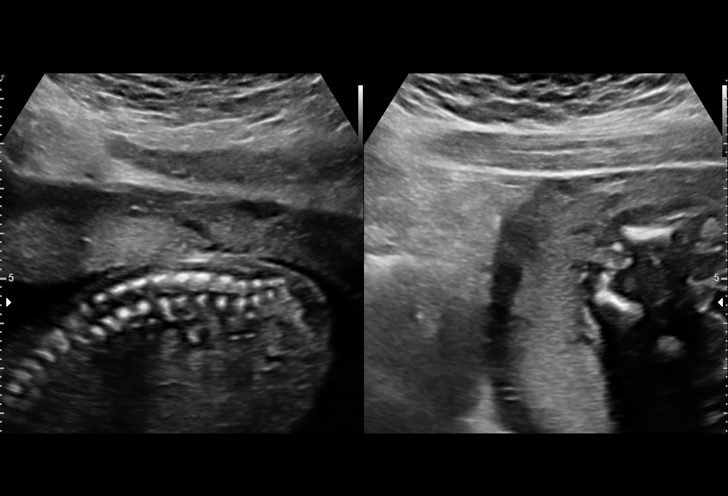
[im 21/80]
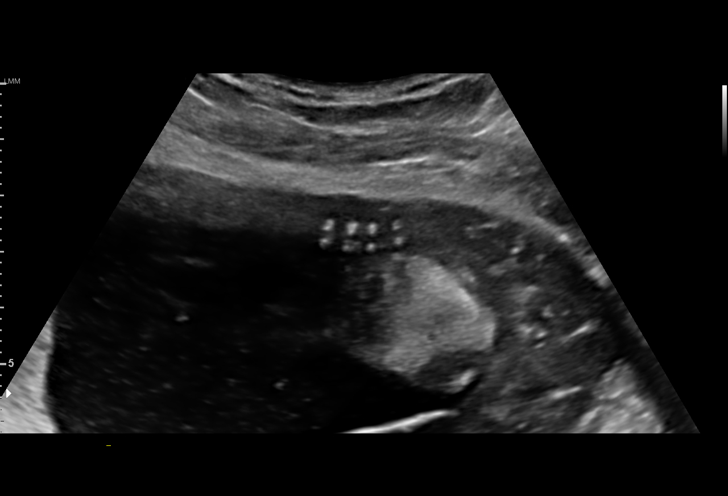
[im 27/80]
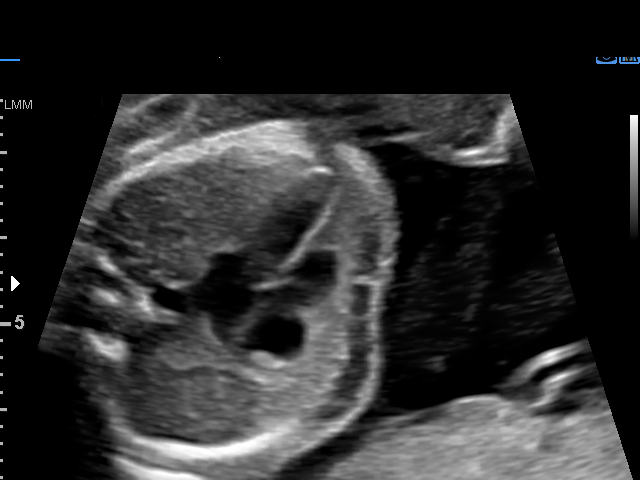
[im 33/80]
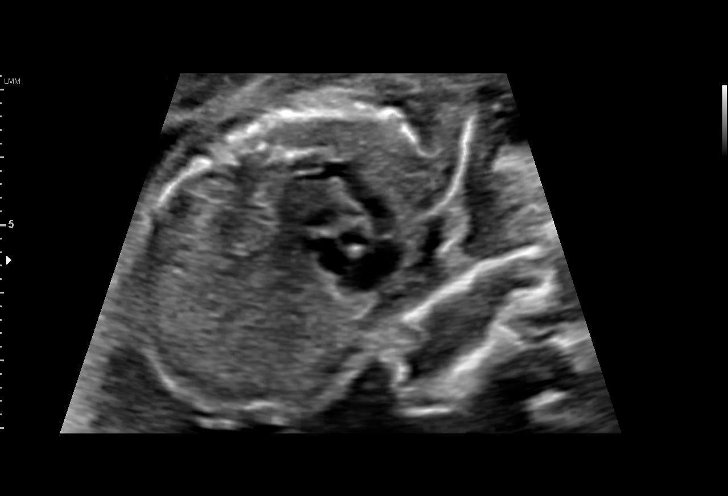
[im 39/80]
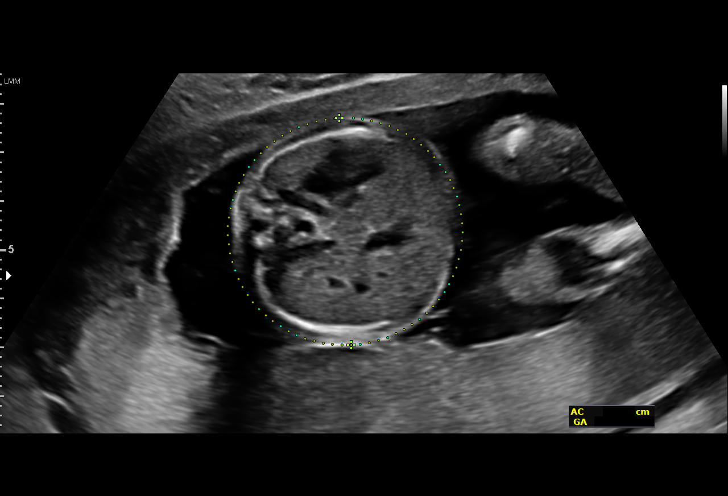
[im 44/80]
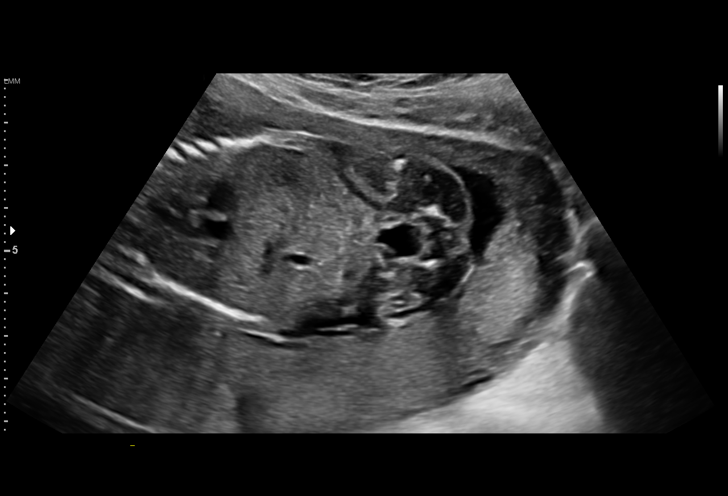
[im 50/80]
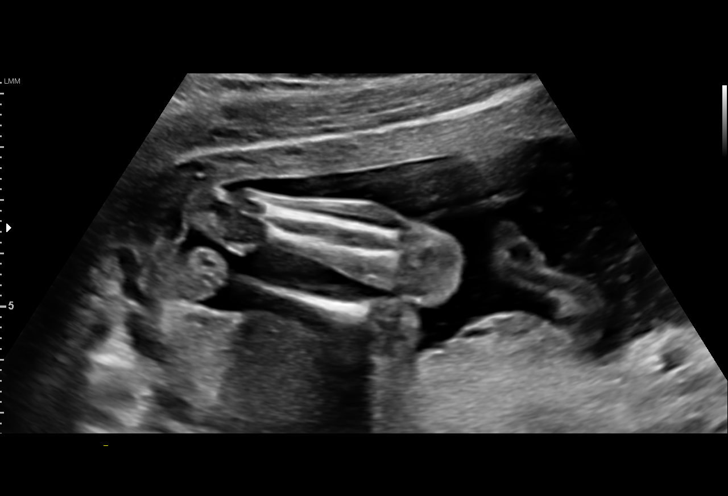
[im 56/80]
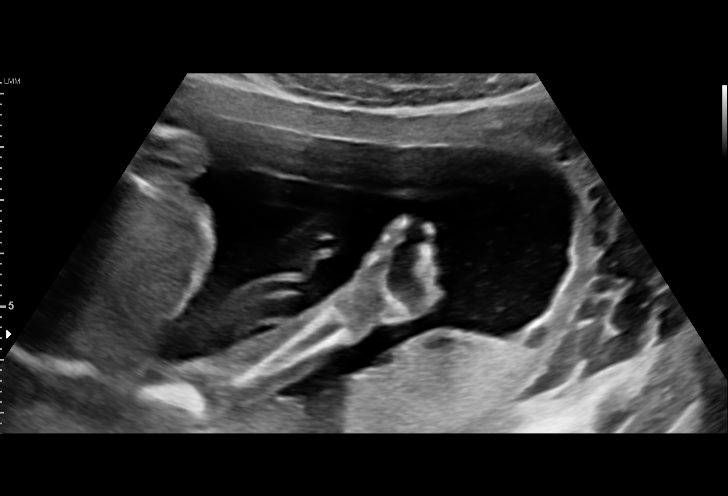
[im 62/80]
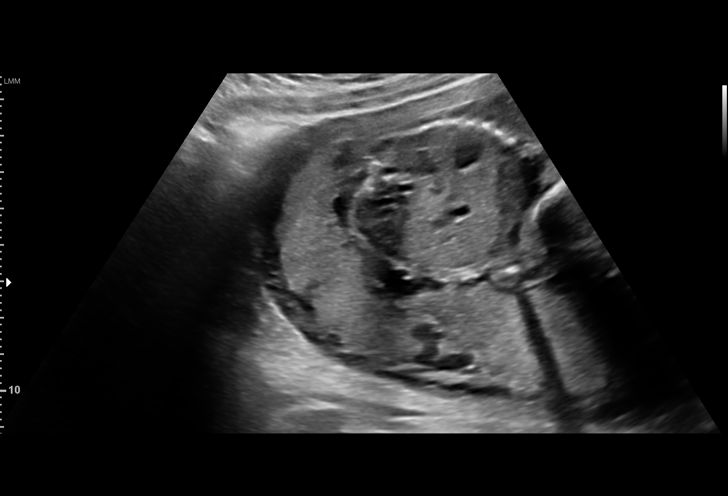
[im 68/80]
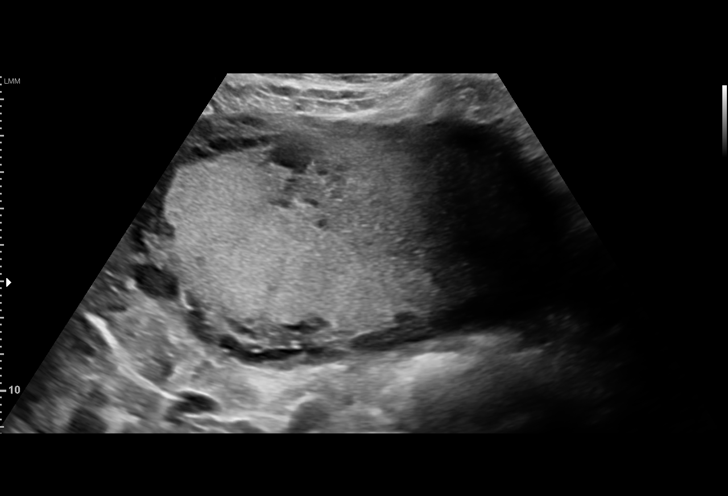
[im 74/80]
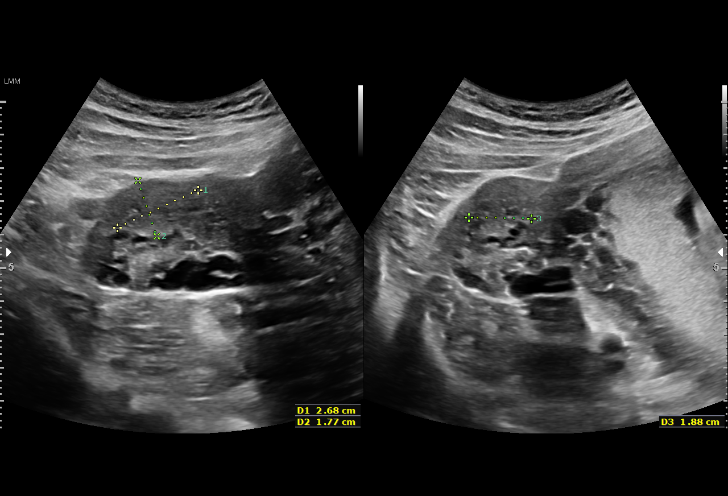
[im 80/80]
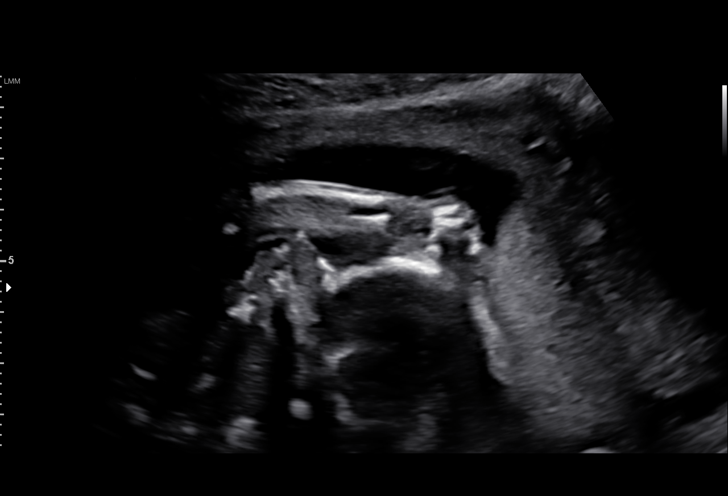

[14 of 28 positions shown; findings below may reference images not displayed]

[REDACTED]-
Faculty Physician

1  JACOBI BASTIAN              [id number]      [id number]     [id number]
Indications

19 weeks gestation of pregnancy
Encounter for fetal anatomic survey
Teratogen exposure in pregnancy,
antepartum,not applicable or unspecified
fetus (Lithium)
OB History

Gravidity:    1
Fetal Evaluation

Num Of Fetuses:     1
Fetal Heart         146
Rate(bpm):
Cardiac Activity:   Observed
Presentation:       Transverse, head to maternal right
Placenta:           Posterior Fundal, above cervical os
P. Cord Insertion:  Visualized, central

Amniotic Fluid
AFI FV:      Subjectively within normal limits

Largest Pocket(cm)
3.58
Biometry

BPD:      44.4  mm     G. Age:  19w 3d         38  %    CI:        76.27   %    70 - 86
FL/HC:      18.4   %    16.8 -
HC:      161.1  mm     G. Age:  18w 6d         12  %    HC/AC:      1.08        1.09 -
AC:      149.7  mm     G. Age:  20w 1d         61  %    FL/BPD:     66.9   %
FL:       29.7  mm     G. Age:  19w 1d         24  %    FL/AC:      19.8   %    20 - 24
HUM:      29.4  mm     G. Age:  19w 4d         50  %
CER:      18.9  mm     G. Age:  18w 3d         14  %
NFT:       3.3  mm

CM:        4.4  mm

Est. FW:     304  gm    0 lb 11 oz      47  %
Gestational Age

LMP:           20w 5d        Date:  04/09/17                 EDD:   01/14/18
U/S Today:     19w 3d                                        EDD:   01/23/18
Best:          19w 5d     Det. By:  U/S C R L  (07/13/17)    EDD:   01/21/18
Anatomy

Cranium:               Appears normal         Aortic Arch:            Appears normal
Cavum:                 Appears normal         Ductal Arch:            Appears normal
Ventricles:            Appears normal         Diaphragm:              Appears normal
Choroid Plexus:        Appears normal         Stomach:                Appears normal, left
sided
Cerebellum:            Appears normal         Abdomen:                Appears normal
Posterior Fossa:       Appears normal         Abdominal Wall:         Appears nml (cord
insert, abd wall)
Nuchal Fold:           Appears normal         Cord Vessels:           Appears normal (3
vessel cord)
Face:                  Appears normal         Kidneys:                Appear normal
(orbits and profile)
Lips:                  Appears normal         Bladder:                Appears normal
Thoracic:              Appears normal         Spine:                  Appears normal
Heart:                 Appears normal         Upper Extremities:      Appears normal
(4CH, axis, and situs
RVOT:                  Appears normal         Lower Extremities:      Appears normal
LVOT:                  Appears normal

Other:  Fetus appears to be a female. Heels and 5th digit appear normal.
Cervix Uterus Adnexa

Cervix
Length:           3.29  cm.
Normal appearance by transabdominal scan.

Uterus
No abnormality visualized.

Left Ovary
Within normal limits.

Right Ovary
Within normal limits.

Cul De Sac:   No free fluid seen.

Adnexa:       No abnormality visualized.
Impression

Single living intrauterine pregnancy at 19 weeks 5 days.
Appropriate fetal growth (47%).
Normal amniotic fluid volume.
The fetal anatomic survey is complete.
Normal fetal anatomy.
No fetal anomalies or soft markers of aneuploidy seen.
Recommendations

Follow-up ultrasounds as clinically indicated.

## 2020-08-06 ENCOUNTER — Other Ambulatory Visit: Payer: Self-pay

## 2020-08-06 DIAGNOSIS — Z20822 Contact with and (suspected) exposure to covid-19: Secondary | ICD-10-CM

## 2020-08-08 LAB — SARS-COV-2, NAA 2 DAY TAT

## 2020-08-08 LAB — NOVEL CORONAVIRUS, NAA: SARS-CoV-2, NAA: NOT DETECTED

## 2020-09-24 ENCOUNTER — Ambulatory Visit (INDEPENDENT_AMBULATORY_CARE_PROVIDER_SITE_OTHER): Payer: Self-pay

## 2020-09-24 ENCOUNTER — Ambulatory Visit (HOSPITAL_COMMUNITY)
Admission: EM | Admit: 2020-09-24 | Discharge: 2020-09-24 | Disposition: A | Discharge status: Home / Self Care | Payer: Self-pay | Attending: Family Medicine | Admitting: Family Medicine

## 2020-09-24 ENCOUNTER — Encounter (HOSPITAL_COMMUNITY): Payer: Self-pay

## 2020-09-24 ENCOUNTER — Other Ambulatory Visit: Payer: Self-pay

## 2020-09-24 DIAGNOSIS — M79631 Pain in right forearm: Secondary | ICD-10-CM

## 2020-09-24 DIAGNOSIS — W19XXXA Unspecified fall, initial encounter: Secondary | ICD-10-CM

## 2020-09-24 DIAGNOSIS — S52251A Displaced comminuted fracture of shaft of ulna, right arm, initial encounter for closed fracture: Secondary | ICD-10-CM

## 2020-09-24 MED ORDER — KETOROLAC TROMETHAMINE 30 MG/ML IJ SOLN
INTRAMUSCULAR | Status: AC
  Filled 2020-09-24: qty 1

## 2020-09-24 MED ORDER — KETOROLAC TROMETHAMINE 30 MG/ML IJ SOLN
30.0000 mg | Freq: Once | INTRAMUSCULAR | Status: AC
  Administered 2020-09-24: 30 mg via INTRAMUSCULAR

## 2020-09-24 NOTE — ED Triage Notes (Signed)
Pt presents with right wrist and forearm injury after a fall upstairs and landing directly on her arm.

## 2020-09-24 NOTE — Discharge Instructions (Signed)
Please follow up early next week.

## 2020-09-24 NOTE — ED Provider Notes (Signed)
[facility]    CSN: [id number] Arrival date & time: 09/24/20  1715      History   Chief Complaint Chief Complaint  Patient presents with  . Wrist Pain  . Arm Injury    HPI Lauren Powers is a 29 y.o. female.   She is presenting with right forearm pain.  Had a fall earlier tonight and landed on the floor.  Having pain in the forearm.  It is worse with any form of movement.  HPI  Past Medical History:  Diagnosis Date  . Anxiety    med prior to pregn  . Bipolar 1 disorder [facility])     Patient Active Problem List   Diagnosis Date Noted  . Normal labor and delivery 01/23/2018    Past Surgical History:  Procedure Laterality Date  . TONSILLECTOMY    . TONSILLECTOMY    . TYMPANOSTOMY TUBE PLACEMENT      OB History    Gravida  1   Para  1   Term  1   Preterm      AB      Living  1     SAB      TAB      Ectopic      Multiple  0   Live Births  1            Home Medications    Prior to Admission medications   Medication Sig Start Date End Date Taking? Authorizing Provider  acetaminophen (TYLENOL) 500 MG tablet Take 500 mg by mouth every 6 (six) hours as needed.    [provider]  HydrOXYzine Pamoate (VISTARIL PO) Take by mouth.    [provider]  ibuprofen (ADVIL,MOTRIN) 600 MG tablet Take 1 tablet (600 mg total) by mouth every 6 (six) hours. 01/25/18   Caro Laroche, DO  Prenatal Vit-Fe Fumarate-FA (PRENATAL VITAMIN PO) Take by mouth.    [provider]  valACYclovir (VALTREX) 500 MG tablet Take 500 mg by mouth 2 (two) times daily.    [provider]    Family History Family History  Problem Relation Age of Onset  . Depression Mother   . Stroke Mother   . Alcohol abuse Father   . COPD Maternal Grandfather   . Alcohol abuse Paternal Grandmother   . Heart disease Paternal Grandfather     Social History Social History   Tobacco Use  . Smoking status: Former Games developer  . Smokeless  tobacco: Never Used  Substance Use Topics  . Alcohol use: No  . Drug use: No    Types: Marijuana    Comment: quit with pregnancy     Allergies   Patient has no known allergies.   Review of Systems Review of Systems  See HPI  Physical Exam Triage Vital Signs ED Triage Vitals  Enc Vitals Group     BP 09/24/20 1845 114/66     Pulse Rate 09/24/20 1845 91     Resp 09/24/20 1845 18     Temp 09/24/20 1845 98.4 F (36.9 C)     Temp Source 09/24/20 1845 Oral     SpO2 09/24/20 1845 100 %     Weight --      Height --      Head Circumference --      Peak Flow --      Pain Score 09/24/20 1844 9     Pain Loc --      Pain Edu? --  Excl. in GC? --    No data found.  Updated Vital Signs BP 114/66 (BP Location: Left Arm)   Pulse 91   Temp 98.4 F (36.9 C) (Oral)   Resp 18   SpO2 100%   Visual Acuity Right Eye Distance:   Left Eye Distance:   Bilateral Distance:    Right Eye Near:   Left Eye Near:    Bilateral Near:     Physical Exam Gen: NAD, alert, cooperative with exam, well-appearing ENT: normal lips, normal nasal mucosa,  Eye: normal EOM, normal conjunctiva and lids Skin: no rashes, no areas of induration  Neuro: normal tone, normal sensation to touch Psych:  normal insight, alert and oriented MSK:  Right forearm: Limited elbow flexion and extension. Swelling and tenderness over the proximal radius. Pain with flexion extension of the wrist. Normal finger range of motion. Neurovascular intact   UC Treatments / Results  Labs (all labs ordered are listed, but only abnormal results are displayed) Labs Reviewed - No data to display  EKG   Radiology DG Forearm Right  Result Date: 09/24/2020 CLINICAL DATA:  Right forearm pain following injury, initial encounter EXAM: RIGHT FOREARM - 2 VIEW COMPARISON:  None. FINDINGS: Comminuted midshaft ulnar fracture is noted with very minimal displacement. No radial fracture is seen. No other focal abnormality is  noted. IMPRESSION: Midshaft ulnar fracture as described. Electronically Signed   By: Alcide Clever M.D.   On: 09/24/2020 19:33    Procedures Procedures (including critical care time)  Medications Ordered in UC Medications  ketorolac (TORADOL) 30 MG/ML injection 30 mg (30 mg Intramuscular Given 09/24/20 2018)    Initial Impression / Assessment and Plan / UC Course  I have reviewed the triage vital signs and the nursing notes.  Pertinent labs & imaging results that were available during my care of the patient were reviewed by me and considered in my medical decision making (see chart for details).     Ms. Loss is a 29 year old female that is presenting with a fracture of the ulnar shaft.  Posterior arm splint was applied.  Counseled on compartment syndrome.  Advise close follow-up.  Toradol was provided to apply the splint.  Final Clinical Impressions(s) / UC Diagnoses   Final diagnoses:  Closed displaced comminuted fracture of shaft of right ulna, initial encounter     Discharge Instructions     Please follow up early next week.      ED Prescriptions    None     PDMP not reviewed this encounter.   Myra Rude, MD 09/24/20 2205

## 2020-09-24 NOTE — Progress Notes (Signed)
Orthopedic Tech Progress Note Patient Details:  Lauren Powers 11-01-1991 [id number] Doctor confirmed Post Long Arm splint instead of Sugartong splint  Ortho Devices Type of Ortho Device: Long arm splint, Arm sling Ortho Device/Splint Location: Right Arm Ortho Device/Splint Interventions: Application, Adjustment   Post Interventions Patient Tolerated: Well Instructions Provided: Care of device, Adjustment of device   Tanesha Arambula E Tamu Golz 09/24/2020, 8:25 PM

## 2020-09-25 ENCOUNTER — Other Ambulatory Visit: Payer: Self-pay | Admitting: Surgical

## 2020-09-25 ENCOUNTER — Telehealth: Payer: Self-pay | Admitting: Orthopedic Surgery

## 2020-09-25 MED ORDER — TRAMADOL HCL 50 MG PO TABS: 50.0000 mg | ORAL_TABLET | Freq: Four times a day (QID) | ORAL | 0 refills | Status: DC | PRN

## 2020-09-25 NOTE — Telephone Encounter (Signed)
Sent in RX for tramadol.  Will see her next friday

## 2020-09-25 NOTE — Telephone Encounter (Signed)
Pt called and informed.

## 2020-09-25 NOTE — Telephone Encounter (Signed)
Pt called stating she was in the ER for a closed displaced comminuted fracture of shaft of right ulna and they didn't give her anything for pain; pt would like to know if Dr.Dean can call something in for her?  [phone]

## 2020-10-02 ENCOUNTER — Ambulatory Visit (INDEPENDENT_AMBULATORY_CARE_PROVIDER_SITE_OTHER): Payer: Self-pay | Admitting: Surgical

## 2020-10-02 ENCOUNTER — Ambulatory Visit (INDEPENDENT_AMBULATORY_CARE_PROVIDER_SITE_OTHER): Payer: Self-pay

## 2020-10-02 ENCOUNTER — Encounter: Payer: Self-pay | Admitting: Surgical

## 2020-10-02 DIAGNOSIS — S52201A Unspecified fracture of shaft of right ulna, initial encounter for closed fracture: Secondary | ICD-10-CM

## 2020-10-02 MED ORDER — HYDROCODONE-ACETAMINOPHEN 5-325 MG PO TABS: 1.0000 | ORAL_TABLET | Freq: Four times a day (QID) | ORAL | 0 refills | Status: DC | PRN

## 2020-10-02 NOTE — Progress Notes (Addendum)
Office Visit Note   Patient: Lauren Powers           Date of Birth: November 22, 1990           MRN: [id number] Visit Date: 10/02/2020 Requested by: Department, [facility] [address] [place],  [place] [address] PCP: Department, [facility]  Subjective: Chief Complaint  Patient presents with   ulnar shaft fx    DOI 09/23/20    HPI: Lauren Powers is a 29 y.o. female who presents to the office complaining of right upper extremity injury.  Patient fell on an outstretched hand last Thursday and was seen in the urgent care on Friday where she was diagnosed with a midshaft ulnar fracture.  She notes pain in the mid forearm as well as pain in her wrist.  She notes wrist pain since the injury that bothers her more than her forearm does at rest.  She is right-hand dominant.  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  She denies any significant medical history though she does have a history of both bone forearm fracture in the contralateral arm when she was younger.  She also notes that she is a cigarette smoker and smokes at least 1 to 2 cigarettes/day..                ROS: All systems reviewed are negative as they relate to the chief complaint within the history of present illness.  Patient denies fevers or chills.  Assessment & Plan: Visit Diagnoses:  1. Closed fracture of shaft of right ulna, unspecified fracture morphology, initial encounter     Plan: Patient is a 29 year old female presents for evaluation of right ulnar fracture.  She has midshaft fracture of the ulna following FOOSH with mild displacement since previous radiographs.  There is comminution at the fracture site.  Discussed patient with Dr. Dorene Grebe.  Radiographs were reviewed.  With patient's history as a smoker, as well as the potential for likely DRUJ injury, plan to proceed with surgery in the form  of ulnar shaft ORIF with DRUJ stabilization.  Discussed options with the patient and she feels that this is the best course of action.  Discussed the potential for casting but I think this will allow for earlier mobilization and return to her duties as a mother and pursuing a job that she can type and do keyboarding work with.  Discussed the risks and benefits of the procedure.  Plan is for ulnar shaft fixation and evaluation of the distal radial ulnar joint for stability.  We may have to pin the DRUJ for 2 to 3 weeks with casting and supination after that.  That really all depends on how stable it feels in comparison with the contralateral wrist after stabilizing the ulna.  Patient will follow-up after surgery.  Follow-Up Instructions: No follow-ups on file.   Orders:  Orders Placed This Encounter  Procedures   XR Forearm Right   Meds ordered this encounter  Medications   HYDROcodone-acetaminophen (NORCO/VICODIN) 5-325 MG tablet    Sig: Take 1 tablet by mouth every 6 (six) hours as needed for moderate pain.    Dispense:  20 tablet    Refill:  0      Procedures: No procedures performed   Clinical Data: No additional findings.  Objective: Vital Signs: There were no vitals taken for this visit.  Physical Exam:  Constitutional: Patient appears well-developed HEENT:  Head: Normocephalic Eyes:EOM are normal Neck: Normal range of motion Cardiovascular: Normal rate Pulmonary/chest: Effort normal Neurologic: Patient is alert Skin: Skin is warm Psychiatric: Patient has normal mood and affect  Ortho Exam: Ortho exam demonstrates right arm with swelling and tenderness along the ulnar shaft.  No tenderness or pain with range of motion of the right shoulder.  Range of motion of the right elbow elicits pain in the forearm.  Patient is tearful throughout exam.  She is very anxious throughout the exam as well.  Tenderness over the location of the DRUJ of the right wrist as well as increased  motion of the ulna away from the radius compared with the contralateral wrist.  No tenderness over the anatomic snuffbox.  No tenderness along the radial shaft.  Swelling around the fracture site and diffusely throughout the right wrist.  2+ radial pulse.  Finger abduction and wrist extension intact.  EPL function intact.  Specialty Comments:  No specialty comments available.  Imaging: No results found.   PMFS History: Patient Active Problem List   Diagnosis Date Noted   Normal labor and delivery 01/23/2018   Past Medical History:  Diagnosis Date   Anxiety    med prior to pregn   Bipolar 1 disorder (HCC)     Family History  Problem Relation Age of Onset   Depression Mother    Stroke Mother    Alcohol abuse Father    COPD Maternal Grandfather    Alcohol abuse Paternal Grandmother    Heart disease Paternal Grandfather     Past Surgical History:  Procedure Laterality Date   TONSILLECTOMY     TONSILLECTOMY     TYMPANOSTOMY TUBE PLACEMENT     Social History   Occupational History   Not on file  Tobacco Use   Smoking status: Former Smoker   Smokeless tobacco: Never Used  Substance and Sexual Activity   Alcohol use: No   Drug use: No    Types: Marijuana    Comment: quit with pregnancy   Sexual activity: Yes    Birth control/protection: None

## 2020-10-03 ENCOUNTER — Other Ambulatory Visit (HOSPITAL_COMMUNITY)
Admission: RE | Admit: 2020-10-03 | Discharge: 2020-10-03 | Disposition: A | Discharge status: Home / Self Care | Payer: HRSA Program | Source: Ambulatory Visit | Attending: Orthopedic Surgery | Admitting: Orthopedic Surgery

## 2020-10-03 DIAGNOSIS — Z20822 Contact with and (suspected) exposure to covid-19: Secondary | ICD-10-CM | POA: Diagnosis not present

## 2020-10-03 DIAGNOSIS — Z01812 Encounter for preprocedural laboratory examination: Secondary | ICD-10-CM | POA: Diagnosis present

## 2020-10-03 LAB — SARS CORONAVIRUS 2 (TAT 6-24 HRS): SARS Coronavirus 2: NEGATIVE

## 2020-10-05 ENCOUNTER — Encounter (HOSPITAL_BASED_OUTPATIENT_CLINIC_OR_DEPARTMENT_OTHER)
Admission: RE | Disposition: A | Discharge status: Home / Self Care | Payer: Self-pay | Source: Home / Self Care | Attending: Orthopedic Surgery

## 2020-10-05 ENCOUNTER — Other Ambulatory Visit: Payer: Self-pay

## 2020-10-05 ENCOUNTER — Ambulatory Visit (HOSPITAL_BASED_OUTPATIENT_CLINIC_OR_DEPARTMENT_OTHER)
Admission: RE | Admit: 2020-10-05 | Discharge: 2020-10-05 | Disposition: A | Discharge status: Home / Self Care | Payer: Self-pay | Attending: Orthopedic Surgery | Admitting: Orthopedic Surgery

## 2020-10-05 ENCOUNTER — Encounter (HOSPITAL_BASED_OUTPATIENT_CLINIC_OR_DEPARTMENT_OTHER): Payer: Self-pay | Admitting: Orthopedic Surgery

## 2020-10-05 ENCOUNTER — Ambulatory Visit (HOSPITAL_BASED_OUTPATIENT_CLINIC_OR_DEPARTMENT_OTHER): Payer: Self-pay | Admitting: Certified Registered"

## 2020-10-05 DIAGNOSIS — S52201A Unspecified fracture of shaft of right ulna, initial encounter for closed fracture: Secondary | ICD-10-CM | POA: Insufficient documentation

## 2020-10-05 DIAGNOSIS — F17200 Nicotine dependence, unspecified, uncomplicated: Secondary | ICD-10-CM | POA: Insufficient documentation

## 2020-10-05 DIAGNOSIS — S52252A Displaced comminuted fracture of shaft of ulna, left arm, initial encounter for closed fracture: Secondary | ICD-10-CM

## 2020-10-05 DIAGNOSIS — W19XXXA Unspecified fall, initial encounter: Secondary | ICD-10-CM | POA: Insufficient documentation

## 2020-10-05 HISTORY — PX: ORIF ULNAR FRACTURE: SHX5417

## 2020-10-05 LAB — CBC
HCT: 44.3 % (ref 36.0–46.0)
Hemoglobin: 14.4 g/dL (ref 12.0–15.0)
MCH: 31.8 pg (ref 26.0–34.0)
MCHC: 32.5 g/dL (ref 30.0–36.0)
MCV: 97.8 fL (ref 80.0–100.0)
Platelets: 314 10*3/uL (ref 150–400)
RBC: 4.53 MIL/uL (ref 3.87–5.11)
RDW: 12.7 % (ref 11.5–15.5)
WBC: 6.4 10*3/uL (ref 4.0–10.5)
nRBC: 0 % (ref 0.0–0.2)

## 2020-10-05 LAB — BASIC METABOLIC PANEL
Anion gap: 10 (ref 5–15)
BUN: 10 mg/dL (ref 6–20)
CO2: 27 mmol/L (ref 22–32)
Calcium: 9.5 mg/dL (ref 8.9–10.3)
Chloride: 100 mmol/L (ref 98–111)
Creatinine, Ser: 0.91 mg/dL (ref 0.44–1.00)
GFR, Estimated: >60 mL/min (ref >60)
Glucose, Bld: 79 mg/dL (ref 70–99)
Potassium: 3.4 mmol/L — ABNORMAL LOW (ref 3.5–5.1)
Sodium: 137 mmol/L (ref 135–145)

## 2020-10-05 LAB — POCT PREGNANCY, URINE: Preg Test, Ur: NEGATIVE

## 2020-10-05 SURGERY — OPEN REDUCTION INTERNAL FIXATION (ORIF) ULNAR FRACTURE
Anesthesia: General | Site: Arm Lower | Laterality: Right

## 2020-10-05 MED ORDER — MIDAZOLAM HCL 2 MG/2ML IJ SOLN
2.0000 mg | Freq: Once | INTRAMUSCULAR | Status: AC
  Administered 2020-10-05: 2 mg via INTRAVENOUS

## 2020-10-05 MED ORDER — POVIDONE-IODINE 7.5 % EX SOLN: Freq: Once | CUTANEOUS | Status: DC

## 2020-10-05 MED ORDER — CEFAZOLIN SODIUM-DEXTROSE 2-4 GM/100ML-% IV SOLN
INTRAVENOUS | Status: AC
  Filled 2020-10-05: qty 100

## 2020-10-05 MED ORDER — MIDAZOLAM HCL 2 MG/2ML IJ SOLN
INTRAMUSCULAR | Status: AC
  Filled 2020-10-05: qty 2

## 2020-10-05 MED ORDER — LIDOCAINE HCL (CARDIAC) PF 100 MG/5ML IV SOSY
PREFILLED_SYRINGE | INTRAVENOUS | Status: DC | PRN
  Administered 2020-10-05: 20 mg via INTRAVENOUS

## 2020-10-05 MED ORDER — CELECOXIB 200 MG PO CAPS
200.0000 mg | ORAL_CAPSULE | Freq: Once | ORAL | Status: AC
  Administered 2020-10-05: 200 mg via ORAL

## 2020-10-05 MED ORDER — LIDOCAINE 2% (20 MG/ML) 5 ML SYRINGE
INTRAMUSCULAR | Status: AC
  Filled 2020-10-05: qty 5

## 2020-10-05 MED ORDER — CEFAZOLIN SODIUM-DEXTROSE 2-4 GM/100ML-% IV SOLN
2.0000 g | INTRAVENOUS | Status: AC
  Administered 2020-10-05: 2 g via INTRAVENOUS

## 2020-10-05 MED ORDER — FENTANYL CITRATE (PF) 100 MCG/2ML IJ SOLN
INTRAMUSCULAR | Status: AC
  Filled 2020-10-05: qty 2

## 2020-10-05 MED ORDER — ACETAMINOPHEN 500 MG PO TABS
1000.0000 mg | ORAL_TABLET | Freq: Once | ORAL | Status: AC
  Administered 2020-10-05: 500 mg via ORAL

## 2020-10-05 MED ORDER — LACTATED RINGERS IV SOLN: INTRAVENOUS | Status: DC

## 2020-10-05 MED ORDER — PROPOFOL 10 MG/ML IV BOLUS
INTRAVENOUS | Status: DC | PRN
  Administered 2020-10-05: 100 mg via INTRAVENOUS
  Administered 2020-10-05: 180 mg via INTRAVENOUS

## 2020-10-05 MED ORDER — DEXAMETHASONE SODIUM PHOSPHATE 10 MG/ML IJ SOLN
INTRAMUSCULAR | Status: AC
  Filled 2020-10-05: qty 1

## 2020-10-05 MED ORDER — ONDANSETRON HCL 4 MG/2ML IJ SOLN
INTRAMUSCULAR | Status: AC
  Filled 2020-10-05: qty 2

## 2020-10-05 MED ORDER — PROPOFOL 10 MG/ML IV BOLUS
INTRAVENOUS | Status: AC
  Filled 2020-10-05: qty 40

## 2020-10-05 MED ORDER — AMISULPRIDE (ANTIEMETIC) 5 MG/2ML IV SOLN: 10.0000 mg | Freq: Once | INTRAVENOUS | Status: DC | PRN

## 2020-10-05 MED ORDER — ONDANSETRON HCL 4 MG/2ML IJ SOLN
INTRAMUSCULAR | Status: DC | PRN
  Administered 2020-10-05: 4 mg via INTRAVENOUS

## 2020-10-05 MED ORDER — METHOCARBAMOL 500 MG PO TABS: 500.0000 mg | ORAL_TABLET | Freq: Three times a day (TID) | ORAL | 0 refills | Status: AC | PRN

## 2020-10-05 MED ORDER — EPHEDRINE SULFATE 50 MG/ML IJ SOLN
INTRAMUSCULAR | Status: DC | PRN
  Administered 2020-10-05: 5 mg via INTRAVENOUS
  Administered 2020-10-05: 15 mg via INTRAVENOUS

## 2020-10-05 MED ORDER — ACETAMINOPHEN 500 MG PO TABS
ORAL_TABLET | ORAL | Status: AC
  Filled 2020-10-05: qty 1

## 2020-10-05 MED ORDER — GLYCOPYRROLATE PF 0.2 MG/ML IJ SOSY
PREFILLED_SYRINGE | INTRAMUSCULAR | Status: AC
  Filled 2020-10-05: qty 2

## 2020-10-05 MED ORDER — FENTANYL CITRATE (PF) 100 MCG/2ML IJ SOLN: 25.0000 ug | INTRAMUSCULAR | Status: DC | PRN

## 2020-10-05 MED ORDER — BUPIVACAINE HCL (PF) 0.5 % IJ SOLN
INTRAMUSCULAR | Status: AC
  Filled 2020-10-05: qty 30

## 2020-10-05 MED ORDER — CLONIDINE HCL (ANALGESIA) 100 MCG/ML EP SOLN
EPIDURAL | Status: DC | PRN
  Administered 2020-10-05: 50 ug

## 2020-10-05 MED ORDER — GLYCOPYRROLATE 0.2 MG/ML IJ SOLN
INTRAMUSCULAR | Status: DC | PRN
  Administered 2020-10-05: .2 mg via INTRAVENOUS

## 2020-10-05 MED ORDER — POVIDONE-IODINE 10 % EX SWAB
2.0000 "application " | Freq: Once | CUTANEOUS | Status: AC
  Administered 2020-10-05: 2 via TOPICAL

## 2020-10-05 MED ORDER — BUPIVACAINE-EPINEPHRINE (PF) 0.5% -1:200000 IJ SOLN
INTRAMUSCULAR | Status: DC | PRN
  Administered 2020-10-05: 30 mL via PERINEURAL

## 2020-10-05 MED ORDER — FENTANYL CITRATE (PF) 100 MCG/2ML IJ SOLN
100.0000 ug | Freq: Once | INTRAMUSCULAR | Status: AC
  Administered 2020-10-05: 100 ug via INTRAVENOUS

## 2020-10-05 MED ORDER — CELECOXIB 200 MG PO CAPS
ORAL_CAPSULE | ORAL | Status: AC
  Filled 2020-10-05: qty 1

## 2020-10-05 MED ORDER — DEXAMETHASONE SODIUM PHOSPHATE 10 MG/ML IJ SOLN
INTRAMUSCULAR | Status: DC | PRN
  Administered 2020-10-05: 5 mg via INTRAVENOUS

## 2020-10-05 MED ORDER — FENTANYL CITRATE (PF) 100 MCG/2ML IJ SOLN
INTRAMUSCULAR | Status: DC | PRN
  Administered 2020-10-05: 25 ug via INTRAVENOUS

## 2020-10-05 MED ORDER — OXYCODONE-ACETAMINOPHEN 5-325 MG PO TABS
1.0000 | ORAL_TABLET | ORAL | 0 refills | Status: DC | PRN
Start: 2020-10-05 — End: 2020-10-14

## 2020-10-05 SURGICAL SUPPLY — 93 items
APL SKNCLS STERI-STRIP NONHPOA (GAUZE/BANDAGES/DRESSINGS) ×1
BENZOIN TINCTURE PRP APPL 2/3 (GAUZE/BANDAGES/DRESSINGS) ×3 IMPLANT
BIT DRILL 110X2.5XQCK CNCT (BIT) IMPLANT
BIT DRILL 2.5 (BIT) ×3
BIT DRILL QC 110 3.5 (BIT) ×1
BIT DRILL QC 110 3.5MM (BIT) IMPLANT
BIT DRL 110X2.5XQCK CNCT (BIT) ×1
BLADE SURG 15 STRL LF DISP TIS (BLADE) ×2 IMPLANT
BLADE SURG 15 STRL SS (BLADE) ×6
BNDG CMPR 9X4 STRL LF SNTH (GAUZE/BANDAGES/DRESSINGS) ×1
BNDG ELASTIC 4X5.8 VLCR STR LF (GAUZE/BANDAGES/DRESSINGS) ×4 IMPLANT
BNDG ELASTIC 6X5.8 VLCR STR LF (GAUZE/BANDAGES/DRESSINGS) IMPLANT
BNDG ESMARK 4X9 LF (GAUZE/BANDAGES/DRESSINGS) ×3 IMPLANT
BNDG GAUZE ELAST 4 BULKY (GAUZE/BANDAGES/DRESSINGS) IMPLANT
CANISTER SUCT 1200ML W/VALVE (MISCELLANEOUS) ×2 IMPLANT
CLEANER CAUTERY TIP 5X5 PAD (MISCELLANEOUS) IMPLANT
CLOSURE WOUND 1/2 X4 (GAUZE/BANDAGES/DRESSINGS)
COVER BACK TABLE 60X90IN (DRAPES) ×3 IMPLANT
COVER WAND RF STERILE (DRAPES) IMPLANT
CUFF TOURN SGL QUICK 18X4 (TOURNIQUET CUFF) ×2 IMPLANT
CUFF TOURN SGL QUICK 24 (TOURNIQUET CUFF)
CUFF TRNQT CYL 24X4X16.5-23 (TOURNIQUET CUFF) IMPLANT
DECANTER SPIKE VIAL GLASS SM (MISCELLANEOUS) IMPLANT
DRAPE EXTREMITY T 121X128X90 (DISPOSABLE) ×3 IMPLANT
DRAPE IMP U-DRAPE 54X76 (DRAPES) ×3 IMPLANT
DRAPE INCISE IOBAN 66X45 STRL (DRAPES) ×3 IMPLANT
DRAPE OEC MINIVIEW 54X84 (DRAPES) ×3 IMPLANT
DRAPE SURG 17X23 STRL (DRAPES) IMPLANT
DRAPE U-SHAPE 47X51 STRL (DRAPES) ×3 IMPLANT
DRILL BIT QC 110 3.5MM (BIT) ×3
DRSG EMULSION OIL 3X3 NADH (GAUZE/BANDAGES/DRESSINGS) IMPLANT
DRSG PAD ABDOMINAL 8X10 ST (GAUZE/BANDAGES/DRESSINGS) ×4 IMPLANT
DRSG TEGADERM 4X4.75 (GAUZE/BANDAGES/DRESSINGS) ×4 IMPLANT
DURAPREP 26ML APPLICATOR (WOUND CARE) ×3 IMPLANT
ELECT REM PT RETURN 9FT ADLT (ELECTROSURGICAL) ×3
ELECTRODE REM PT RTRN 9FT ADLT (ELECTROSURGICAL) ×1 IMPLANT
GAUZE 4X4 16PLY RFD (DISPOSABLE) IMPLANT
GAUZE SPONGE 4X4 12PLY STRL (GAUZE/BANDAGES/DRESSINGS) ×2 IMPLANT
GAUZE XEROFORM 1X8 LF (GAUZE/BANDAGES/DRESSINGS) IMPLANT
GLOVE BIO SURGEON STRL SZ7 (GLOVE) ×3 IMPLANT
GLOVE BIOGEL PI IND STRL 7.0 (GLOVE) ×1 IMPLANT
GLOVE BIOGEL PI IND STRL 8 (GLOVE) ×1 IMPLANT
GLOVE BIOGEL PI INDICATOR 7.0 (GLOVE) ×6
GLOVE BIOGEL PI INDICATOR 8 (GLOVE) ×2
GLOVE SURG ORTHO 8.0 STRL STRW (GLOVE) ×3 IMPLANT
GOWN STRL REUS W/ TWL LRG LVL3 (GOWN DISPOSABLE) ×2 IMPLANT
GOWN STRL REUS W/ TWL XL LVL3 (GOWN DISPOSABLE) ×1 IMPLANT
GOWN STRL REUS W/TWL LRG LVL3 (GOWN DISPOSABLE) ×9
GOWN STRL REUS W/TWL XL LVL3 (GOWN DISPOSABLE) ×6 IMPLANT
NEEDLE HYPO 22GX1.5 SAFETY (NEEDLE) IMPLANT
NS IRRIG 1000ML POUR BTL (IV SOLUTION) ×5 IMPLANT
PACK BASIN DAY SURGERY FS (CUSTOM PROCEDURE TRAY) ×3 IMPLANT
PAD CAST 4YDX4 CTTN HI CHSV (CAST SUPPLIES) IMPLANT
PAD CLEANER CAUTERY TIP 5X5 (MISCELLANEOUS)
PADDING CAST ABS 4INX4YD NS (CAST SUPPLIES) ×2
PADDING CAST ABS COTTON 4X4 ST (CAST SUPPLIES) ×1 IMPLANT
PADDING CAST COTTON 4X4 STRL (CAST SUPPLIES)
PADDING CAST COTTON 6X4 STRL (CAST SUPPLIES) IMPLANT
PENCIL SMOKE EVACUATOR (MISCELLANEOUS) ×3 IMPLANT
PLATE 7 HOLE (Plate) ×2 IMPLANT
SCREW CORT 2.5X20X3.5XST SM (Screw) IMPLANT
SCREW CORT S/T 3.5X22 (Screw) ×6 IMPLANT
SCREW CORTICAL 3.5 16MM (Screw) ×2 IMPLANT
SCREW CORTICAL 3.5 18MM (Screw) ×6 IMPLANT
SCREW CORTICAL 3.5X20 (Screw) ×6 IMPLANT
SHEET MEDIUM DRAPE 40X70 STRL (DRAPES) IMPLANT
SLING ARM FOAM STRAP LRG (SOFTGOODS) ×2 IMPLANT
SPLINT FAST PLASTER 5X30 (CAST SUPPLIES) ×20
SPLINT PLASTER CAST FAST 5X30 (CAST SUPPLIES) IMPLANT
SPLINT PLASTER CAST XFAST 3X15 (CAST SUPPLIES) IMPLANT
SPLINT PLASTER CAST XFAST 4X15 (CAST SUPPLIES) IMPLANT
SPLINT PLASTER XTRA FAST SET 4 (CAST SUPPLIES)
SPLINT PLASTER XTRA FASTSET 3X (CAST SUPPLIES)
STOCKINETTE 4X48 STRL (DRAPES) ×3 IMPLANT
STOCKINETTE IMPERVIOUS LG (DRAPES) ×2 IMPLANT
STRIP CLOSURE SKIN 1/2X4 (GAUZE/BANDAGES/DRESSINGS) IMPLANT
SUCTION FRAZIER HANDLE 10FR (MISCELLANEOUS)
SUCTION TUBE FRAZIER 10FR DISP (MISCELLANEOUS) IMPLANT
SUT MNCRL AB 3-0 PS2 18 (SUTURE) ×2 IMPLANT
SUT MON AB 3-0 SH 27 (SUTURE) ×3
SUT MON AB 3-0 SH27 (SUTURE) IMPLANT
SUT PROLENE 3 0 PS 2 (SUTURE) IMPLANT
SUT VIC AB 0 CT1 27 (SUTURE) ×6
SUT VIC AB 0 CT1 27XBRD ANBCTR (SUTURE) IMPLANT
SUT VIC AB 2-0 CT1 27 (SUTURE) ×6
SUT VIC AB 2-0 CT1 TAPERPNT 27 (SUTURE) IMPLANT
SYR BULB EAR ULCER 3OZ GRN STR (SYRINGE) ×3 IMPLANT
SYR CONTROL 10ML LL (SYRINGE) IMPLANT
TOWEL GREEN STERILE FF (TOWEL DISPOSABLE) ×3 IMPLANT
TUBE CONNECTING 20'X1/4 (TUBING) ×1
TUBE CONNECTING 20X1/4 (TUBING) ×1 IMPLANT
UNDERPAD 30X36 HEAVY ABSORB (UNDERPADS AND DIAPERS) ×3 IMPLANT
YANKAUER SUCT BULB TIP NO VENT (SUCTIONS) ×2 IMPLANT

## 2020-10-05 NOTE — Anesthesia Procedure Notes (Signed)
Procedure Name: LMA Insertion Date/Time: 10/05/2020 1:18 PM Performed by: Lauralyn Primes, CRNA Pre-anesthesia Checklist: Patient identified, Emergency Drugs available, Suction available and Patient being monitored Patient Re-evaluated:Patient Re-evaluated prior to induction Oxygen Delivery Method: Circle system utilized Preoxygenation: Pre-oxygenation with 100% oxygen Induction Type: IV induction Ventilation: Mask ventilation without difficulty LMA: LMA inserted LMA Size: 4.0 Number of attempts: 1 Airway Equipment and Method: Bite block Placement Confirmation: positive ETCO2 Tube secured with: Tape Dental Injury: Teeth and Oropharynx as per pre-operative assessment

## 2020-10-05 NOTE — Transfer of Care (Signed)
Immediate Anesthesia Transfer of Care Note  Patient: Armed forces technical officer  Procedure(s) Performed: RIGHT ULNAR SHAFT FRACTURE OPEN REDUCTION INTERNAL FIXATION (Right Arm Lower)  Patient Location: PACU  Anesthesia Type:GA combined with regional for post-op pain  Level of Consciousness: awake, alert  and oriented  Airway & Oxygen Therapy: Patient Spontanous Breathing and Patient connected to face mask oxygen  Post-op Assessment: Report given to RN and Post -op Vital signs reviewed and stable  Post vital signs: Reviewed and stable  Last Vitals:  Vitals Value Taken Time  BP 123/70 10/05/20 1502  Temp    Pulse 66 10/05/20 1506  Resp 13 10/05/20 1506  SpO2 100 % 10/05/20 1506  Vitals shown include unvalidated device data.  Last Pain:  Vitals:   10/05/20 0949  TempSrc: Oral  PainSc: 8       Patients Stated Pain Goal: 5 (10/05/20 0949)  Complications: No complications documented.

## 2020-10-05 NOTE — Anesthesia Postprocedure Evaluation (Signed)
Anesthesia Post Note  Patient: Armed forces technical officer  Procedure(s) Performed: RIGHT ULNAR SHAFT FRACTURE OPEN REDUCTION INTERNAL FIXATION (Right Arm Lower)     Patient location during evaluation: PACU Anesthesia Type: General and Regional Level of consciousness: awake and alert Pain management: pain level controlled Vital Signs Assessment: post-procedure vital signs reviewed and stable Respiratory status: spontaneous breathing, nonlabored ventilation, respiratory function stable and patient connected to nasal cannula oxygen Cardiovascular status: blood pressure returned to baseline and stable Postop Assessment: no apparent nausea or vomiting Anesthetic complications: no   No complications documented.  Last Vitals:  Vitals:   10/05/20 1520 10/05/20 1529  BP: 117/71 96/61  Pulse: (!) 55 81  Resp: 13 16  Temp:  36.9 C  SpO2: 100% 100%    Last Pain:  Vitals:   10/05/20 1529  TempSrc:   PainSc: 0-No pain                 Jaskaran Dauzat

## 2020-10-05 NOTE — Anesthesia Preprocedure Evaluation (Signed)
Anesthesia Evaluation  Patient identified by MRN, date of birth, ID band Patient awake    Reviewed: Allergy & Precautions, NPO status , Patient's Chart, lab work & pertinent test results  Airway Mallampati: II  TM Distance: >3 FB Neck ROM: Full    Dental  (+) Dental Advisory Given   Pulmonary Current Smoker,    breath sounds clear to auscultation       Cardiovascular negative cardio ROS   Rhythm:Regular Rate:Normal     Neuro/Psych negative neurological ROS     GI/Hepatic negative GI ROS, Neg liver ROS,   Endo/Other  negative endocrine ROS  Renal/GU negative Renal ROS     Musculoskeletal   Abdominal   Peds  Hematology negative hematology ROS (+)   Anesthesia Other Findings   Reproductive/Obstetrics                             Anesthesia Physical Anesthesia Plan  ASA: II  Anesthesia Plan: General   Post-op Pain Management:  Regional for Post-op pain   Induction: Intravenous  PONV Risk Score and Plan: 2 and Dexamethasone, Ondansetron and Treatment may vary due to age or medical condition  Airway Management Planned: LMA  Additional Equipment:   Intra-op Plan:   Post-operative Plan: Extubation in OR  Informed Consent: I have reviewed the patients History and Physical, chart, labs and discussed the procedure including the risks, benefits and alternatives for the proposed anesthesia with the patient or authorized representative who has indicated his/her understanding and acceptance.     Dental advisory given  Plan Discussed with: CRNA  Anesthesia Plan Comments:         Anesthesia Quick Evaluation

## 2020-10-05 NOTE — H&P (Signed)
Lauren Powers is an 29 y.o. female.   Chief Complaint: Right arm pain HPI: Lauren Powers is a 29 year old patient who fell and injured her right arm.  Fall on outstretched arm and hand.  Denies any snuffbox tenderness but does report mid forearm pain as well as worsening right wrist pain over the past several days.  She does have some shortening of the fracture based on sequential radiographs.  She is right-hand dominant.  Presents now for operative management of ulnar shaft fracture and possible DRUJ injury.  Past Medical History:  Diagnosis Date  . Anxiety    med prior to pregn  . Bipolar 1 disorder [facility])     Past Surgical History:  Procedure Laterality Date  . TONSILLECTOMY    . TONSILLECTOMY    . TYMPANOSTOMY TUBE PLACEMENT      Family History  Problem Relation Age of Onset  . Depression Mother   . Stroke Mother   . Alcohol abuse Father   . COPD Maternal Grandfather   . Alcohol abuse Paternal Grandmother   . Heart disease Paternal Grandfather    Social History:  reports that she has been smoking. She has never used smokeless tobacco. She reports that she does not drink alcohol and does not use drugs.  Allergies: No Known Allergies  Medications Prior to Admission  Medication Sig Dispense Refill  . HYDROcodone-acetaminophen (NORCO/VICODIN) 5-325 MG tablet Take 1 tablet by mouth every 6 (six) hours as needed for moderate pain. 20 tablet 0  . ibuprofen (ADVIL,MOTRIN) 600 MG tablet Take 1 tablet (600 mg total) by mouth every 6 (six) hours. 30 tablet 0  . Prenatal Vit-Fe Fumarate-FA (PRENATAL VITAMIN PO) Take by mouth.      Results for orders placed or performed during the Powers encounter of 10/05/20 (from the past 48 hour(s))  Pregnancy, urine POC     Status: None   Collection Time: 10/05/20  9:30 AM  Result Value Ref Range   Preg Test, Ur NEGATIVE NEGATIVE    Comment:        THE SENSITIVITY OF THIS METHODOLOGY IS >24 mIU/mL   CBC     Status: None   Collection Time:  10/05/20  9:42 AM  Result Value Ref Range   WBC 6.4 4.0 - 10.5 K/uL   RBC 4.53 3.87 - 5.11 MIL/uL   Hemoglobin 14.4 12.0 - 15.0 g/dL   HCT 24.5 36 - 46 %   MCV 97.8 80.0 - 100.0 fL   MCH 31.8 26.0 - 34.0 pg   MCHC 32.5 30.0 - 36.0 g/dL   RDW 80.9 98.3 - 38.2 %   Platelets 314 150 - 400 K/uL   nRBC 0.0 0.0 - 0.2 %    Comment: Performed at [facility], [address], [place], [place] [address]  Basic metabolic panel     Status: Abnormal   Collection Time: 10/05/20  9:42 AM  Result Value Ref Range   Sodium 137 135 - 145 mmol/L   Potassium 3.4 (L) 3.5 - 5.1 mmol/L   Chloride 100 98 - 111 mmol/L   CO2 27 22 - 32 mmol/L   Glucose, Bld 79 70 - 99 mg/dL    Comment: Glucose reference range applies only to samples taken after fasting for at least 8 hours.   BUN 10 6 - 20 mg/dL   Creatinine, Ser 7.67 0.44 - 1.00 mg/dL   Calcium 9.5 8.9 - 34.1 mg/dL   GFR, Estimated >93 >79 mL/min    Comment: (  NOTE) Calculated using the CKD-EPI Creatinine Equation (2021)    Anion gap 10 5 - 15    Comment: Performed at [facility], [address], [place], [place] [address]   No results found.  Review of Systems  Musculoskeletal: Positive for arthralgias.  All other systems reviewed and are negative.   Blood pressure (!) 92/53, pulse 61, temperature 98.4 F (36.9 C), temperature source Oral, resp. rate 12, height 5\' 11"  (1.803 m), weight 62 kg, SpO2 100 %, unknown if currently breastfeeding. Physical Exam Vitals reviewed.  HENT:     Head: Normocephalic.     Nose: Nose normal.     Mouth/Throat:     Mouth: Mucous membranes are moist.  Eyes:     Conjunctiva/sclera: Conjunctivae normal.  Cardiovascular:     Rate and Rhythm: Normal rate.     Pulses: Normal pulses.  Pulmonary:     Effort: Pulmonary effort is normal.  Abdominal:     General: Abdomen is flat.  Musculoskeletal:     Cervical back: Normal range of motion.  Skin:    General: Skin is warm.     Capillary Refill:  Capillary refill takes less than 2 seconds.  Neurological:     General: No focal deficit present.     Mental Status: She is alert.  Psychiatric:        Mood and Affect: Mood normal.   Examination of the right arm demonstrates mild swelling around the midshaft ulna region.  Elbow range of motion nontender.  Skin is intact in that right arm region.  Pulses intact.  Assessment/Plan Impression is ulnar shaft fracture with possible DRUJ involvement and some shortening of the fracture compared to radiographs at the time of injury.  Patient is developing worsening wrist pain as a result of her injury.  Elbow radiographically appears intact with reduction of the radial head.  Plan at this time is open reduction internal fixation of the ulnar shaft fracture with evaluation of the distal radial ulnar joint after fracture fixation.  If necessary we may pin the DRUJ but that would be an unusual injury pattern.  Risk and benefits are discussed include not limited to infection nerve vessel damage incomplete resolution of wrist pain as well as nonunion and malunion.  Patient is a smoker and that could adversely affect healing.  Patient understands risk benefits and wishes to proceed.  All questions answered , MD 10/05/2020, 12:46 PM

## 2020-10-05 NOTE — Progress Notes (Signed)
Assisted Dr. Rob Fitzgerald with right, ultrasound guided, supraclavicular block. Side rails up, monitors on throughout procedure. See vital signs in flow sheet. Tolerated Procedure well. 

## 2020-10-05 NOTE — Discharge Instructions (Signed)
Postoperative Anesthesia Instructions-Pediatric  Activity: Your child should rest for the remainder of the day. A responsible individual must stay with your child for 24 hours.  Meals: Your child should start with liquids and light foods such as gelatin or soup unless otherwise instructed by the physician. Progress to regular foods as tolerated. Avoid spicy, greasy, and heavy foods. If nausea and/or vomiting occur, drink only clear liquids such as apple juice or Pedialyte until the nausea and/or vomiting subsides. Call your physician if vomiting continues.  Special Instructions/Symptoms: Your child may be drowsy for the rest of the day, although some children experience some hyperactivity a few hours after the surgery. Your child may also experience some irritability or crying episodes due to the operative procedure and/or anesthesia. Your child's throat may feel dry or sore from the anesthesia or the breathing tube placed in the throat during surgery. Use throat lozenges, sprays, or ice chips if needed.   May take tylenol and ibuprofen after 5:30PM, If needed.

## 2020-10-05 NOTE — Brief Op Note (Signed)
   10/05/2020  2:21 PM  PATIENT:  Marchelle Folks Lisenbee  29 y.o. female  PRE-OPERATIVE DIAGNOSIS:  right ulnar shart fracture with possible distal radioulnar joint injury  POST-OPERATIVE DIAGNOSIS:  right ulnar shart fracture   PROCEDURE:  Procedure(s): RIGHT ULNAR SHAFT FRACTURE OPEN REDUCTION INTERNAL FIXATION WITH POSSIBLE DISTAL RADIOULNAR JOINT SCREW  SURGEON:  Surgeon(s): Cammy Copa, MD  ASSISTANT: magnant pa  ANESTHESIA:   general  EBL: 15 ml    Total I/O In: 1000 [I.V.:1000] Out: -   BLOOD ADMINISTERED: none  DRAINS: none   LOCAL MEDICATIONS USED:  none  SPECIMEN:  No Specimen  COUNTS:  YES  TOURNIQUET:   Total Tourniquet Time Documented: Upper Arm (Right) - 31 minutes Total: Upper Arm (Right) - 31 minutes   DICTATION: .Other Dictation: Dictation Number [id number]   PLAN OF CARE: dc after pacu  PATIENT DISPOSITION:  PACU - hemodynamically stable

## 2020-10-05 NOTE — Anesthesia Procedure Notes (Signed)
Anesthesia Regional Block: Supraclavicular block   Pre-Anesthetic Checklist: ,, timeout performed, Correct Patient, Correct Site, Correct Laterality, Correct Procedure, Correct Position, site marked, Risks and benefits discussed,  Surgical consent,  Pre-op evaluation,  At surgeon's request and post-op pain management  Laterality: Right  Prep: chloraprep       Needles:  Injection technique: Single-shot  Needle Type: Echogenic Stimulator Needle     Needle Length: 9cm  Needle Gauge: 21     Additional Needles:   Procedures:, nerve stimulator,,, ultrasound used (permanent image in chart),,,,   Nerve Stimulator or Paresthesia:  Response: biceps, 0.5 mA,   Additional Responses:   Narrative:  Start time: 10/05/2020 11:33 AM End time: 10/05/2020 11:40 AM Injection made incrementally with aspirations every 5 mL.  Performed by: Personally  Anesthesiologist: Marcene Duos, MD

## 2020-10-06 NOTE — Op Note (Signed)
[name], [name] MEDICAL RECORD [id number] ACCOUNT [id number] DATE OF BIRTH:25-Aug-1991 FACILITY: [facility] LOCATION: [facility] PHYSICIAN:Cillian Gwinner Diamantina Providence, MD  OPERATIVE REPORT  DATE OF PROCEDURE:  10/05/2020  PREOPERATIVE DIAGNOSIS:  Right ulnar shaft fracture, displaced and shortened.  POSTOPERATIVE DIAGNOSIS:  Right ulnar shaft fracture, displaced and shortened.  PROCEDURE:  Right ulnar shaft fracture open reduction internal fixation using a 7-hole plate from [employer] and cortical nonlocking screws.  SURGEON:  Cammy Copa, MD  ASSISTANT:  Karenann Cai PA.  INDICATIONS:  This is a 29 year old patient with right forearm and wrist pain.  She presents for operative management after explanation of risks and benefits.  PROCEDURE IN DETAIL:  The patient was brought to the operating room where general anesthetic was induced.  Preoperative antibiotics administered.  Timeout was called.  Right arm prescrubbed with alcohol and Betadine, allowed to air dry.  Prepped with  DuraPrep solution and draped in sterile manner.  Collier Flowers was used to cover the operative field.  A tourniquet was elevated to 250 mmHg.  Incision made centered over the fracture extending about 4 cm proximal and distal.  Skin and subcutaneous tissue were  sharply divided.  The periosteum over the fracture was divided.  Care was taken to avoid injury to the ulnar nerve and its branches.  Subperiosteal elevation was performed.  Fracture was reduced.  It was shortened.  Fracture was reduced and a 7-hole  plate applied and screws placed in compression mode to achieve optimal alignment.  Fluoroscopy used to confirm correct placement in the AP and lateral planes.  Distal radioulnar joint intact with no instability based on both preoperative examination,  post-fixation examination in comparison to the left wrist.  Elbow had excellent range of motion with no issues with the radial head.  Tourniquet released.  Bleeding points  encountered, controlled with electrocautery.  Thorough irrigation was performed.   Fascia over the plate was closed using 0 Vicryl suture followed by interrupted inverted 2-0 Vicryl suture, 3-0 Monocryl and a posterior splint applied.  The patient tolerated the procedure well without immediate complications.  She was transferred to the  recovery room in stable condition.  Luke's assistance was required at all times for opening and closing, retraction as well as mobilization of tissues.  His assistance was medical necessity.  HN/NUANCE  D:10/05/2020 T:10/06/2020 JOB:[id number]

## 2020-10-07 ENCOUNTER — Encounter (HOSPITAL_BASED_OUTPATIENT_CLINIC_OR_DEPARTMENT_OTHER): Payer: Self-pay | Admitting: Orthopedic Surgery

## 2020-10-14 ENCOUNTER — Ambulatory Visit (INDEPENDENT_AMBULATORY_CARE_PROVIDER_SITE_OTHER): Payer: Self-pay | Admitting: Orthopedic Surgery

## 2020-10-14 ENCOUNTER — Ambulatory Visit (INDEPENDENT_AMBULATORY_CARE_PROVIDER_SITE_OTHER): Payer: Self-pay

## 2020-10-14 DIAGNOSIS — S52201A Unspecified fracture of shaft of right ulna, initial encounter for closed fracture: Secondary | ICD-10-CM

## 2020-10-14 MED ORDER — HYDROCODONE-ACETAMINOPHEN 5-325 MG PO TABS
1.0000 | ORAL_TABLET | Freq: Every day | ORAL | 0 refills | Status: AC | PRN
Start: 2020-10-14 — End: ?

## 2020-10-17 ENCOUNTER — Encounter: Payer: Self-pay | Admitting: Orthopedic Surgery
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
## 2020-10-18 ENCOUNTER — Encounter: Payer: Self-pay | Admitting: Orthopedic Surgery

## 2020-10-28 ENCOUNTER — Ambulatory Visit: Payer: Self-pay | Admitting: Orthopedic Surgery

## 2020-10-29 ENCOUNTER — Ambulatory Visit (INDEPENDENT_AMBULATORY_CARE_PROVIDER_SITE_OTHER): Payer: Self-pay

## 2020-10-29 ENCOUNTER — Ambulatory Visit (INDEPENDENT_AMBULATORY_CARE_PROVIDER_SITE_OTHER): Payer: Self-pay | Admitting: Orthopedic Surgery

## 2020-10-29 ENCOUNTER — Encounter: Payer: Self-pay | Admitting: Orthopedic Surgery

## 2020-10-29 DIAGNOSIS — S52201A Unspecified fracture of shaft of right ulna, initial encounter for closed fracture: Secondary | ICD-10-CM

## 2020-10-29 NOTE — Progress Notes (Signed)
   Office Visit Note   Patient: Lauren Powers           Date of Birth: July 27, 1991           MRN: [id number] Visit Date: 10/29/2020 Requested by: Department, [facility] [address] [place],  [place] [address] PCP: Department, [facility]  Subjective: Chief Complaint  Patient presents with  . Right Forearm - Routine Post Op    HPI: f/u/right ulnar shaft orif              ROS: ok  Assessment & Plan: Visit Diagnoses:  1. Closed fracture of shaft of right ulna, unspecified fracture morphology, initial encounter     Plan: no sling no lifting ok for rom healing may be delayed b/c of smoking  Follow-Up Instructions: No follow-ups on file.   Orders:  Orders Placed This Encounter  Procedures  . XR Forearm Right   No orders of the defined types were placed in this encounter.     Procedures: No procedures performed   Clinical Data: No additional findings.  Objective: Vital Signs: There were no vitals taken for this visit.  Physical Exam: ok  Ortho Exam: incision ok swelling decreasing per luke  Specialty Comments:  No specialty comments available.  Imaging: XR Forearm Right  Result Date: 10/29/2020 AP lateral right forearm reviewed.  Ulnar shaft fixation hardware in good position and alignment with no significant change compared to prior radiographs.  No loosening of the screws or lucencies around the plate.  Fracture alignment itself remains unchanged compared to prior radiographs    PMFS History: Patient Active Problem List   Diagnosis Date Noted  . Normal labor and delivery 01/23/2018   Past Medical History:  Diagnosis Date  . Anxiety    med prior to pregn  . Bipolar 1 disorder (HCC)     Family History  Problem Relation Age of Onset  . Depression Mother   . Stroke Mother   . Alcohol abuse Father   . COPD Maternal Grandfather   . Alcohol abuse Paternal Grandmother   . Heart disease Paternal Grandfather     Past  Surgical History:  Procedure Laterality Date  . ORIF ULNAR FRACTURE Right 10/05/2020   Procedure: RIGHT ULNAR SHAFT FRACTURE OPEN REDUCTION INTERNAL FIXATION;  Surgeon: Cammy Copa, MD;  Location: [facility];  Service: Orthopedics;  Laterality: Right;  . TONSILLECTOMY    . TONSILLECTOMY    . TYMPANOSTOMY TUBE PLACEMENT     Social History   Occupational History  . Not on file  Tobacco Use  . Smoking status: Current Every Day Smoker  . Smokeless tobacco: Never Used  Substance and Sexual Activity  . Alcohol use: No  . Drug use: No    Types: Marijuana    Comment: quit with pregnancy  . Sexual activity: Yes    Birth control/protection: None

## 2020-11-01 ENCOUNTER — Encounter: Payer: Self-pay | Admitting: Orthopedic Surgery

## 2020-11-01 NOTE — Progress Notes (Signed)
   Post-Op Visit Note   Patient: Lauren Powers           Date of Birth: 20-Aug-1991           MRN: [id number] Visit Date: 10/29/2020 PCP: Department, [facility]   Assessment & Plan:  Chief Complaint:  Chief Complaint  Patient presents with  . Right Forearm - Routine Post Op   Visit Diagnoses:  1. Closed fracture of shaft of right ulna, unspecified fracture morphology, initial encounter     Plan: Patient is a 29 year old female presents s/p right ulnar shaft ORIF on 10/05/2020. She is doing well and states that she has no significant pain. She is not having to take any pain medication. Her wrist pain has improved since her procedure as well. She notes that she still continues to smoke though she is try to cut back as best as she can. She has been sticking in the sling since last office visit though she does not bring the sling with her to the office today. On exam, she has no significant tenderness over the fracture site and the incision is healing well without any evidence of infection or dehiscence. She has intact EPL, wrist extension, finger abduction, finger adduction with no significant numbness in the right hand. 2+ radial pulse of the right upper extremity. Radiographs of the right forearm taken today show very slight angulation at the fracture site but an equivalent view is not available for comparison from prior radiographs. Overall, with how she is doing clinically, plan to transition out of the sling to using a right forearm brace. No lifting more than a couple pounds. Follow-up in 4 weeks for clinical recheck with x-rays. If she is doing well at that time, plan to let her progressively return to lifting fully and likely follow-up as needed after that.  Follow-Up Instructions: No follow-ups on file.   Orders:  Orders Placed This Encounter  Procedures  . XR Forearm Right   No orders of the defined types were placed in this encounter.   Imaging: No results  found.  PMFS History: Patient Active Problem List   Diagnosis Date Noted  . Normal labor and delivery 01/23/2018   Past Medical History:  Diagnosis Date  . Anxiety    med prior to pregn  . Bipolar 1 disorder (HCC)     Family History  Problem Relation Age of Onset  . Depression Mother   . Stroke Mother   . Alcohol abuse Father   . COPD Maternal Grandfather   . Alcohol abuse Paternal Grandmother   . Heart disease Paternal Grandfather     Past Surgical History:  Procedure Laterality Date  . ORIF ULNAR FRACTURE Right 10/05/2020   Procedure: RIGHT ULNAR SHAFT FRACTURE OPEN REDUCTION INTERNAL FIXATION;  Surgeon: Cammy Copa, MD;  Location: [facility];  Service: Orthopedics;  Laterality: Right;  . TONSILLECTOMY    . TONSILLECTOMY    . TYMPANOSTOMY TUBE PLACEMENT     Social History   Occupational History  . Not on file  Tobacco Use  . Smoking status: Current Every Day Smoker  . Smokeless tobacco: Never Used  Substance and Sexual Activity  . Alcohol use: No  . Drug use: No    Types: Marijuana    Comment: quit with pregnancy  . Sexual activity: Yes    Birth control/protection: None

## 2020-11-26 ENCOUNTER — Ambulatory Visit: Payer: Self-pay | Admitting: Orthopedic Surgery

## 2021-05-09 ENCOUNTER — Ambulatory Visit (HOSPITAL_COMMUNITY)
Admission: EM | Admit: 2021-05-09 | Discharge: 2021-05-09 | Disposition: A | Discharge status: Home / Self Care | Payer: Medicaid Other | Attending: Psychiatry | Admitting: Psychiatry

## 2021-05-09 ENCOUNTER — Other Ambulatory Visit: Payer: Self-pay

## 2021-05-09 DIAGNOSIS — Z7289 Other problems related to lifestyle: Secondary | ICD-10-CM | POA: Diagnosis not present

## 2021-05-09 DIAGNOSIS — F1290 Cannabis use, unspecified, uncomplicated: Secondary | ICD-10-CM | POA: Insufficient documentation

## 2021-05-09 DIAGNOSIS — F332 Major depressive disorder, recurrent severe without psychotic features: Secondary | ICD-10-CM | POA: Insufficient documentation

## 2021-05-09 DIAGNOSIS — F4323 Adjustment disorder with mixed anxiety and depressed mood: Secondary | ICD-10-CM | POA: Insufficient documentation

## 2021-05-09 MED ORDER — HYDROXYZINE PAMOATE 25 MG PO CAPS
25.0000 mg | ORAL_CAPSULE | Freq: Three times a day (TID) | ORAL | 0 refills | Status: AC | PRN
Start: 2021-05-09 — End: ?
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
## 2021-05-09 NOTE — ED Notes (Signed)
Pt discharged with resources in hand. Verbalized understanding of discharge instructions reviewed by staff. Safety  Maintained.
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
## 2021-05-09 NOTE — Discharge Instructions (Addendum)

## 2021-05-14 ENCOUNTER — Telehealth (HOSPITAL_COMMUNITY): Payer: Self-pay | Admitting: *Deleted

## 2021-05-14 NOTE — BH Assessment (Signed)
Care Management - Follow Up [facility] Discharges   Writer attempted to make contact with patient today and was unsuccessful.  Writer was able to leave a HIPPA compliant voice message and will await callback.   
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
## 2022-08-20 ENCOUNTER — Encounter (HOSPITAL_COMMUNITY): Payer: Self-pay | Admitting: *Deleted

## 2022-08-20 ENCOUNTER — Emergency Department (HOSPITAL_COMMUNITY)
Admission: EM | Admit: 2022-08-20 | Discharge: 2022-08-20 | Disposition: A | Discharge status: Home / Self Care | Payer: Medicaid Other | Attending: Emergency Medicine | Admitting: Emergency Medicine

## 2022-08-20 ENCOUNTER — Other Ambulatory Visit: Payer: Self-pay

## 2022-08-20 ENCOUNTER — Ambulatory Visit (HOSPITAL_COMMUNITY)
Admission: EM | Admit: 2022-08-20 | Discharge: 2022-08-20 | Disposition: A | Discharge status: Home / Self Care | Payer: No Typology Code available for payment source | Attending: Emergency Medicine | Admitting: Emergency Medicine

## 2022-08-20 DIAGNOSIS — F17210 Nicotine dependence, cigarettes, uncomplicated: Secondary | ICD-10-CM | POA: Diagnosis not present

## 2022-08-20 DIAGNOSIS — T7421XA Adult sexual abuse, confirmed, initial encounter: Secondary | ICD-10-CM | POA: Diagnosis present

## 2022-08-20 DIAGNOSIS — Z0441 Encounter for examination and observation following alleged adult rape: Secondary | ICD-10-CM | POA: Diagnosis present

## 2022-08-20 LAB — POC URINE PREG, ED: Preg Test, Ur: NEGATIVE

## 2022-08-20 MED ORDER — AZITHROMYCIN 250 MG PO TABS
1000.0000 mg | ORAL_TABLET | Freq: Once | ORAL | Status: AC
  Administered 2022-08-20: 1000 mg via ORAL

## 2022-08-20 MED ORDER — LIDOCAINE HCL (PF) 1 % IJ SOLN: 1.0000 mL | Freq: Once | INTRAMUSCULAR | Status: DC

## 2022-08-20 MED ORDER — IBUPROFEN 400 MG PO TABS
600.0000 mg | ORAL_TABLET | Freq: Once | ORAL | Status: AC
  Administered 2022-08-20: 600 mg via ORAL
  Filled 2022-08-20: qty 2

## 2022-08-20 MED ORDER — CEFTRIAXONE SODIUM 500 MG IJ SOLR
500.0000 mg | Freq: Once | INTRAMUSCULAR | Status: AC
  Administered 2022-08-20: 500 mg via INTRAMUSCULAR
  Filled 2022-08-20: qty 500

## 2022-08-20 MED ORDER — HYDROXYZINE HCL 25 MG PO TABS
25.0000 mg | ORAL_TABLET | Freq: Once | ORAL | Status: AC
  Administered 2022-08-20: 25 mg via ORAL
  Filled 2022-08-20: qty 1

## 2022-08-20 MED ORDER — PROMETHAZINE HCL 12.5 MG PO TABS
25.0000 mg | ORAL_TABLET | Freq: Four times a day (QID) | ORAL | Status: DC | PRN
  Administered 2022-08-20: 75 mg via ORAL

## 2022-08-20 MED ORDER — METRONIDAZOLE 500 MG PO TABS
2000.0000 mg | ORAL_TABLET | Freq: Once | ORAL | Status: AC
  Administered 2022-08-20: 2000 mg via ORAL

## 2022-08-20 MED ORDER — ULIPRISTAL ACETATE 30 MG PO TABS
30.0000 mg | ORAL_TABLET | Freq: Once | ORAL | Status: AC
  Administered 2022-08-20: 30 mg via ORAL
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
## 2022-08-20 NOTE — SANE Note (Signed)
Follow-up Phone Call  Patient gives verbal consent for a FNE/SANE follow-up phone call in 48-72 hours: DID NOT ASK THE PT. Patient's telephone number: [phone] (PT'S CELL W/ VM & TEXTING). Patient gives verbal consent to leave voicemail at the phone number listed above: DID NOT ASK THE PT. DO NOT CALL between the hours of: N/A   PT'S EMAIL:  [email or web address]  [facility] OFFICE CASE NUMBER:  [id number] DETECTIVE EL Mickie Kay   [facility] Meriter #:  [id number]  PT TO HAVE FOLLOW-UP TESTING AT [place].
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
## 2022-08-20 NOTE — SANE Note (Signed)
On 08/20/2022, at approximately 2125 hours, the SANE/FNE Naval architect) consult was completed. The primary RN has been notified. Please contact the SANE/FNE nurse on call (listed in [place]) with any further concerns.
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
## 2022-08-20 NOTE — SANE Note (Signed)
Discharge Instructions and Medications were discussed with the patient.  She is free to be discharged.

## 2022-08-20 NOTE — ED Notes (Signed)
SANE RN to patient's room at this time.
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
## 2022-08-20 NOTE — Discharge Instructions (Signed)
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 signs of neuroleptic malignant syndrome Infection-fever, chills, cough, or sore throat Liver injury-right upper belly pain, loss of appetite, nausea, light-colored stool, dark yellow or brown urine, yellowing skin or eyes, unusual weakness or fatigue Seizures Sudden eye pain or change in vision  such as blurry vision, seeing halos around lights, vision loss Trouble passing urine Uncontrolled and repetitive body movements, muscle stiffness or spasms, tremors or shaking, loss of balance or coordination, restlessness, shuffling walk, which may be signs of extrapyramidal symptoms (EPS) Side effects that usually do not require medical attention (report to your care team if they continue or are  bothersome): Confusion Constipation Dizziness Drowsiness Dry mouth Sensitivity to light Vivid dreams or nightmares This list may not describe all possible side effects. Call your doctor for medical advice about side effects. You may report side effects to FDA at 1-800-FDA-1088. Where should I keep my medication? Keep out of the reach of children. Store at room temperature, between 20 and 25 degrees C (68 and 77 degrees F). Protect from light. Throw away any unused medication after the expiration date. NOTE: This sheet is a summary. It may not cover all possible information. If you have questions about this medicine, talk to your doctor, pharmacist, or health care provider.  2022 [employer] Standard (2021-02-16 10:57:27)     Azithromycin Tablets-TWO TABLETS SENT HOME WITH PATIENT.  CAN TAKE BOTH TABLETS AT ONCE.  What is this medication? AZITHROMYCIN (az ith roe MYE sin) treats infections caused by bacteria. It belongs to a group of medications called antibiotics. It will not treat colds, the flu, or infections caused by viruses. This medicine may be used for other purposes; ask your health care provider or pharmacist if you have questions. COMMON BRAND NAME(S): Zithromax, Zithromax Tri-Pak, Zithromax Z-Pak What should I tell my care team before I take this medication? They need to know if you have any of these conditions: History of blood diseases, like leukemia History of irregular heartbeat Kidney disease Liver disease Myasthenia gravis An unusual or allergic reaction to azithromycin, erythromycin, other macrolide antibiotics, foods, dyes, or preservatives Pregnant or trying to get pregnant Breast-feeding How should I use this medication? Take this medication by mouth with a full glass of water. Follow the directions on the prescription label. The tablets can be taken with food or on an empty stomach. If the medication upsets your stomach, take it with food. Take your  medication at regular intervals. Do not take your medication more often than directed. Take all of your medication as directed even if you think you are better. Do not skip doses or stop your medication early. Talk to your care team regarding the use of this medication in children. While this medication may be prescribed for children as young as 6 months for selected conditions, precautions do apply. Overdosage: If you think you have taken too much of this medicine contact a poison control center or emergency room at once. NOTE: This medicine is only for you. Do not share this medicine with others. What if I miss a dose? If you miss a dose, take it as soon as you can. If it is almost time for your next dose, take only that dose. Do not take double or extra doses. What may interact with this medication? Do not take this medication with any of the following: Cisapride Dronedarone Pimozide Thioridazine This medication may also interact with the following: Antacids that contain aluminum or magnesium Birth control pills Colchicine Cyclosporine Digoxin Ergot alkaloids like dihydroergotamine, ergotamine Nelfinavir Other medications that prolong the QT interval (an abnormal heart rhythm) Phenytoin Warfarin This list may not describe all possible interactions. Give your health care provider a list of all the medicines, herbs, non-prescription drugs, or dietary supplements you use. Also tell them  if you smoke, drink alcohol, or use illegal drugs. Some items may interact with your medicine. What should I watch for while using this medication? Tell your care team if your symptoms do not start to get better or if they get worse. This medication may cause serious skin reactions. They can happen weeks to months after starting the medication. Contact your care team right away if you notice fevers or flu-like symptoms with a rash. The rash may be red or purple and then turn into blisters or peeling of the  skin. Or, you might notice a red rash with swelling of the face, lips or lymph nodes in your neck or under your arms. Do not treat diarrhea with over the counter products. Contact your care team if you have diarrhea that lasts more than 2 days or if it is severe and watery. This medication can make you more sensitive to the sun. Keep out of the sun. If you cannot avoid being in the sun, wear protective clothing and use sunscreen. Do not use sun lamps or tanning beds/booths. What side effects may I notice from receiving this medication? Side effects that you should report to your care team as soon as possible: Allergic reactions or angioedema-skin rash, itching, hives, swelling of the face, eyes, lips, tongue, arms, or legs, trouble swallowing or breathing Heart rhythm changes-fast or irregular heartbeat, dizziness, feeling faint or lightheaded, chest pain, trouble breathing Liver injury-right upper belly pain, loss of appetite, nausea, light-colored stool, dark yellow or brown urine, yellowing skin or eyes, unusual weakness or fatigue Rash, fever, and swollen lymph nodes Redness, blistering, peeling, or loosening of the skin, including inside the mouth Severe diarrhea, fever Unusual vaginal discharge, itching, or odor Side effects that usually do not require medical attention (report to your care team if they continue or are bothersome): Diarrhea Nausea Stomach pain Vomiting This list may not describe all possible side effects. Call your doctor for medical advice about side effects. You may report side effects to FDA at 1-800-FDA-1088. Where should I keep my medication? Keep out of the reach of children and pets. Store at room temperature between 15 and 30 degrees C (59 and 86 degrees F). Throw away any unused medication after the expiration date. NOTE: This sheet is a summary. It may not cover all possible information. If you have questions about this medicine, talk to your doctor, pharmacist,  or health care provider.  2022 [employer] Standard (2020-09-30 11:19:31)         Metronidazole (4 pills at once)-FOUR TABLETS SENT HOME WITH PATIENT.  WAIT 48 HOURS AFTER ALCOHOL CONSUMPTION.  CAN TAKE ALL FOUR TABLETS AT ONCE, OR TWO TABLETS BEFORE AND TWO TABLETS AFTER A MEAL.  WAIT 48 HOURS AFTER TAKING MEDICATION BEFORE CONSUMING ALCOHOL. Also known as:  Flagyl   Metronidazole Capsules or Tablets What is this medication? METRONIDAZOLE (me troe NI da zole) treats infections caused by bacteria or parasites. It belongs to a group of medications called antibiotics. It will not treat colds, the flu, or infections caused by viruses. This medicine may be used for other purposes; ask your health care provider or pharmacist if you have questions. COMMON BRAND NAME(S): Flagyl What should I tell my care team before I take this medication? They need to know if you have any of these conditions: Cockayne syndrome History of blood diseases such as sickle cell anemia, anemia, or leukemia If you often drink alcohol Irregular heartbeat or rhythm Kidney disease Liver disease Yeast or fungal  infection An unusual or allergic reaction to metronidazole, nitroimidazoles, or other medications, foods, dyes, or preservatives Pregnant or trying to get pregnant Breast-feeding How should I use this medication? Take this medication by mouth with water. Take it as directed on the prescription label at the same time every day. Take all of this medication unless your care team tells you to stop it early. Keep taking it even if you think you are better. Talk to your care team about the use of this medication in children. While it may be prescribed for children for selected conditions, precautions do apply. Overdosage: If you think you have taken too much of this medicine contact a poison control center or emergency room at once. NOTE: This medicine is only for you. Do not share this medicine with others. What  if I miss a dose? If you miss a dose, take it as soon as you can. If it is almost time for your next dose, take only that dose. Do not take double or extra doses. What may interact with this medication? Do not take this medication with any of the following: Alcohol or any product that contains alcohol Cisapride Disulfiram Dronedarone Pimozide Thioridazine This medication may also interact with the following: Birth control pills Busulfan Carbamazepine Certain medications that treat or prevent blood clots like warfarin Cimetidine Lithium Other medications that prolong the QT interval (cause an abnormal heart rhythm) Phenobarbital Phenytoin This list may not describe all possible interactions. Give your health care provider a list of all the medicines, herbs, non-prescription drugs, or dietary supplements you use. Also tell them if you smoke, drink alcohol, or use illegal drugs. Some items may interact with your medicine. What should I watch for while using this medication? Tell your care team if your symptoms do not start to get better or if they get worse. Some products may contain alcohol. Ask your care team if this medication contains alcohol. Be sure to tell all care teams you are taking this medication. Certain medications, such as metronidazole and disulfiram, can cause an unpleasant reaction when taken with alcohol. The reaction includes flushing, headache, nausea, vomiting, sweating, and increased thirst. The reaction can last from 30 minutes to several hours. If you are being treated for a sexually transmitted disease (STD), avoid sexual contact until you have finished your treatment. Your sexual partner may also need treatment. Birth control may not work properly while you are taking this medication. Talk to your care team about using an extra method of birth control. What side effects may I notice from receiving this medication? Side effects that you should report to your care  team as soon as possible: Allergic reactions-skin rash, itching, hives, swelling of the face, lips, tongue, or throat Dizziness, loss of balance or coordination, confusion or trouble speaking Fever, neck pain or stiffness, sensitivity to light, headache, nausea, vomiting, confusion Heart rhythm changes-fast or irregular heartbeat, dizziness, feeling faint or lightheaded, chest pain, trouble breathing Liver injury-right upper belly pain, loss of appetite, nausea, light-colored stool, dark yellow or brown urine, yellowing skin or eyes, unusual weakness or fatigue Pain, tingling, or numbness in the hands or feet Redness, blistering, peeling, or loosening of the skin, including inside the mouth Seizures Severe diarrhea, fever Sudden eye pain or change in vision such as blurry vision, seeing halos around lights, vision loss Unusual vaginal discharge, itching, or odor Side effects that usually do not require medical attention (report to your care team if they continue or are bothersome): Diarrhea Metallic  taste in mouth Nausea Stomach pain This list may not describe all possible side effects. Call your doctor for medical advice about side effects. You may report side effects to FDA at 1-800-FDA-1088. Where should I keep my medication? Keep out of the reach of children and pets. Store between 15 and 25 degrees C (59 and 77 degrees F). Protect from light. Get rid of any unused medication after the expiration date. To get rid of medications that are no longer needed or have expired: Take the medication to a medication take-back program. Check with your pharmacy or law enforcement to find a location. If you cannot return the medication, check the label or package insert to see if the medication should be thrown out in the garbage or flushed down the toilet. If you are not sure, ask your care team. If it is safe to put it in the trash, take the medication out of the container. Mix the medication with cat  litter, dirt, coffee grounds, or other unwanted substance. Seal the mixture in a bag or container. Put it in the trash. NOTE: This sheet is a summary. It may not cover all possible information. If you have questions about this medicine, talk to your doctor, pharmacist, or health care provider.  2022 [employer] Standard (2020-12-31 13:29:17)
